# Patient Record
Sex: Female | Born: 1955 | Race: White | Hispanic: No | Smoking: Former smoker
Health system: Southern US, Community
[De-identification: ages and names within clinical notes are randomized; demographics above are authoritative.]

## PROBLEM LIST (undated history)

## (undated) DIAGNOSIS — I10 Essential (primary) hypertension: Secondary | ICD-10-CM

## (undated) DIAGNOSIS — F329 Major depressive disorder, single episode, unspecified: Secondary | ICD-10-CM

## (undated) DIAGNOSIS — F32A Depression, unspecified: Secondary | ICD-10-CM

## (undated) DIAGNOSIS — E785 Hyperlipidemia, unspecified: Secondary | ICD-10-CM

## (undated) HISTORY — DX: Depression, unspecified: F32.A

## (undated) HISTORY — DX: Essential (primary) hypertension: I10

## (undated) HISTORY — DX: Hyperlipidemia, unspecified: E78.5

---
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
## 2009-06-13 ENCOUNTER — Emergency Department (HOSPITAL_BASED_OUTPATIENT_CLINIC_OR_DEPARTMENT_OTHER): Admission: EM | Admit: 2009-06-13 | Discharge: 2009-06-13 | Payer: Self-pay | Admitting: Emergency Medicine

## 2012-04-19 ENCOUNTER — Emergency Department (HOSPITAL_BASED_OUTPATIENT_CLINIC_OR_DEPARTMENT_OTHER): Payer: BC Managed Care – PPO

## 2012-04-19 ENCOUNTER — Emergency Department (HOSPITAL_BASED_OUTPATIENT_CLINIC_OR_DEPARTMENT_OTHER)
Admission: EM | Admit: 2012-04-19 | Discharge: 2012-04-19 | Disposition: A | Discharge status: Home / Self Care | Payer: BC Managed Care – PPO | Attending: Emergency Medicine | Admitting: Emergency Medicine

## 2012-04-19 ENCOUNTER — Encounter (HOSPITAL_BASED_OUTPATIENT_CLINIC_OR_DEPARTMENT_OTHER): Payer: Self-pay | Admitting: Emergency Medicine

## 2012-04-19 DIAGNOSIS — Z79899 Other long term (current) drug therapy: Secondary | ICD-10-CM | POA: Insufficient documentation

## 2012-04-19 DIAGNOSIS — R05 Cough: Secondary | ICD-10-CM | POA: Insufficient documentation

## 2012-04-19 DIAGNOSIS — IMO0001 Reserved for inherently not codable concepts without codable children: Secondary | ICD-10-CM | POA: Insufficient documentation

## 2012-04-19 DIAGNOSIS — J029 Acute pharyngitis, unspecified: Secondary | ICD-10-CM | POA: Insufficient documentation

## 2012-04-19 DIAGNOSIS — R110 Nausea: Secondary | ICD-10-CM | POA: Insufficient documentation

## 2012-04-19 DIAGNOSIS — R0789 Other chest pain: Secondary | ICD-10-CM | POA: Insufficient documentation

## 2012-04-19 DIAGNOSIS — R509 Fever, unspecified: Secondary | ICD-10-CM | POA: Insufficient documentation

## 2012-04-19 DIAGNOSIS — R630 Anorexia: Secondary | ICD-10-CM | POA: Insufficient documentation

## 2012-04-19 DIAGNOSIS — B349 Viral infection, unspecified: Secondary | ICD-10-CM

## 2012-04-19 DIAGNOSIS — F17200 Nicotine dependence, unspecified, uncomplicated: Secondary | ICD-10-CM | POA: Insufficient documentation

## 2012-04-19 DIAGNOSIS — J3489 Other specified disorders of nose and nasal sinuses: Secondary | ICD-10-CM | POA: Insufficient documentation

## 2012-04-19 DIAGNOSIS — R51 Headache: Secondary | ICD-10-CM | POA: Insufficient documentation

## 2012-04-19 DIAGNOSIS — R059 Cough, unspecified: Secondary | ICD-10-CM | POA: Insufficient documentation

## 2012-04-19 DIAGNOSIS — B9789 Other viral agents as the cause of diseases classified elsewhere: Secondary | ICD-10-CM | POA: Insufficient documentation

## 2012-04-19 MED ORDER — KETOROLAC TROMETHAMINE 30 MG/ML IJ SOLN
30.0000 mg | Freq: Once | INTRAMUSCULAR | Status: AC
  Administered 2012-04-19: 30 mg via INTRAVENOUS
  Filled 2012-04-19: qty 1

## 2012-04-19 MED ORDER — SODIUM CHLORIDE 0.9 % IV BOLUS (SEPSIS)
1000.0000 mL | Freq: Once | INTRAVENOUS | Status: AC
  Administered 2012-04-19: 1000 mL via INTRAVENOUS

## 2012-04-19 MED ORDER — OSELTAMIVIR PHOSPHATE 75 MG PO CAPS: 75.0000 mg | ORAL_CAPSULE | Freq: Two times a day (BID) | ORAL | Status: DC

## 2012-04-19 NOTE — ED Provider Notes (Signed)
History     CSN: [id number]  Arrival date & time 04/19/12  0906   First MD Initiated Contact with Patient 04/19/12 1035      Chief Complaint  Patient presents with  . Cough  . Fever  . Headache  . soreness in ribs     (Consider location/radiation/quality/duration/timing/severity/associated sxs/prior treatment) HPI Comments: Patient presents with a two-day history of flulike symptoms. She states she's very fatigued and has no energy. She has myalgias. She has a bifrontal headache it radiates to the top of her head. She denies any neck pain other than her baseline chronic mild neck pain. She states that she's not having neck stiffness. She does have a cough which she says is dry and nonproductive. She says she sore across her chest from coughing. She denies any shortness of breath. She has a little bit of a sore throat but denies any trouble swallowing. She has little bit of nasal congestion. She has had some subjective fevers. She's had some nausea but no vomiting or diarrhea.  Patient is a 56 y.o. female presenting with cough, fever, and headaches.  Cough Associated symptoms include chills, headaches and myalgias. Pertinent negatives include no chest pain, no rhinorrhea and no shortness of breath.  Fever Primary symptoms of the febrile illness include fever, fatigue, headaches, cough, nausea and myalgias. Primary symptoms do not include shortness of breath, abdominal pain, vomiting, diarrhea, arthralgias or rash.  The headache is associated with weakness (Generalized). The headache is not associated with neck stiffness.  The myalgias are associated with weakness (Generalized).  Headache  Associated symptoms include a fever and nausea. Pertinent negatives include no shortness of breath and no vomiting.    History reviewed. No pertinent past medical history.  History reviewed. No pertinent past surgical history.  History reviewed. No pertinent family history.  History  Substance  Use Topics  . Smoking status: Current Some Day Smoker  . Smokeless tobacco: Not on file  . Alcohol Use: Yes     Comment: rare    OB History    Grav Para Term Preterm Abortions TAB SAB Ect Mult Living                  Review of Systems  Constitutional: Positive for fever, chills, appetite change and fatigue. Negative for diaphoresis.  HENT: Negative for congestion, rhinorrhea, sneezing and neck stiffness.   Eyes: Negative.   Respiratory: Positive for cough. Negative for chest tightness and shortness of breath.   Cardiovascular: Negative for chest pain and leg swelling.  Gastrointestinal: Positive for nausea. Negative for vomiting, abdominal pain, diarrhea and blood in stool.  Genitourinary: Negative for frequency, hematuria, flank pain and difficulty urinating.  Musculoskeletal: Positive for myalgias. Negative for back pain and arthralgias.  Skin: Negative for rash.  Neurological: Positive for weakness (Generalized) and headaches. Negative for dizziness, speech difficulty and numbness.    Allergies  Review of patient's allergies indicates no known allergies.  Home Medications   Current Outpatient Rx  Name  Route  Sig  Dispense  Refill  . OSELTAMIVIR PHOSPHATE 75 MG PO CAPS   Oral   Take 1 capsule (75 mg total) by mouth every 12 (twelve) hours.   10 capsule   0     BP 116/60  Pulse 92  Temp 99.9 F (37.7 C) (Oral)  Resp 20  Ht 5\' 1"  (1.549 m)  Wt 165 lb (74.844 kg)  BMI 31.18 kg/m2  SpO2 98%  Physical Exam  Constitutional: She is  oriented to person, place, and time. She appears well-developed and well-nourished.  HENT:  Head: Normocephalic and atraumatic.  Mouth/Throat: Oropharynx is clear and moist.  Eyes: Pupils are equal, round, and reactive to light.  Neck: Normal range of motion. Neck supple.       No neck stiffness  Cardiovascular: Normal rate, regular rhythm and normal heart sounds.   Pulmonary/Chest: Effort normal and breath sounds normal. No  respiratory distress. She has no wheezes. She has no rales. She exhibits no tenderness.  Abdominal: Soft. Bowel sounds are normal. There is no tenderness. There is no rebound and no guarding.  Musculoskeletal: Normal range of motion. She exhibits no edema.  Lymphadenopathy:    She has no cervical adenopathy.  Neurological: She is alert and oriented to person, place, and time.  Skin: Skin is warm and dry. No rash noted.  Psychiatric: She has a normal mood and affect.    ED Course  Procedures (including critical care time)  Labs Reviewed - No data to display No results found.   1. Viral syndrome       MDM  Patient presents with likely viral syndrome. She does have a headache but no neck stiffness and states that her neck pain is at baseline. I have a low suspicion of meningitis however I did discuss with the patient that the only way to rule out meningitis is to do a spinal tap. Patient is refusing this and wants to go home at this point. I did advise her to return if her symptoms are not improving. I feel that her symptoms are most consistent with influenza and I will give her prescription for Tamiflu.        Rolan Bucco, MD 04/19/12 [phone]

## 2012-04-19 NOTE — ED Notes (Signed)
Pt states she has non-productive cough, headache, fever since yesterday.  Pt states her ribs are sore from coughing.  Denies chest pain or SOB.  Pt relates she has been unable to sleep.

## 2015-10-13 DIAGNOSIS — F3342 Major depressive disorder, recurrent, in full remission: Secondary | ICD-10-CM | POA: Insufficient documentation

## 2015-10-13 DIAGNOSIS — Z860101 Personal history of adenomatous and serrated colon polyps: Secondary | ICD-10-CM

## 2015-10-13 DIAGNOSIS — Z0000 Encounter for general adult medical examination without abnormal findings: Secondary | ICD-10-CM | POA: Insufficient documentation

## 2015-10-13 DIAGNOSIS — K219 Gastro-esophageal reflux disease without esophagitis: Secondary | ICD-10-CM

## 2015-10-13 DIAGNOSIS — Z86010 Personal history of colonic polyps: Secondary | ICD-10-CM

## 2015-10-13 HISTORY — DX: Personal history of adenomatous and serrated colon polyps: Z86.0101

## 2015-10-13 HISTORY — DX: Gastro-esophageal reflux disease without esophagitis: K21.9

## 2015-10-13 HISTORY — DX: Encounter for general adult medical examination without abnormal findings: Z00.00

## 2015-10-13 HISTORY — DX: Major depressive disorder, recurrent, in full remission: F33.42

## 2019-05-13 ENCOUNTER — Telehealth: Payer: Self-pay | Admitting: General Practice

## 2019-05-13 NOTE — Telephone Encounter (Signed)
Please schedule patient for new patient appointment. Thank you!!! 

## 2019-05-13 NOTE — Telephone Encounter (Signed)
Pt would like to know if Dr Patsy Lager will accept her as a pt?  Her sister is Conservation officer, nature.  Is that OK?  He dr is retiring

## 2019-05-17 NOTE — Telephone Encounter (Signed)
LM for pt to call and schedule new pt appt with Dr. Patsy Lager.

## 2019-05-20 ENCOUNTER — Ambulatory Visit: Payer: BC Managed Care – PPO

## 2019-05-20 ENCOUNTER — Ambulatory Visit: Payer: Medicare PPO | Attending: Internal Medicine

## 2019-05-20 ENCOUNTER — Other Ambulatory Visit: Payer: BC Managed Care – PPO

## 2019-05-20 DIAGNOSIS — Z23 Encounter for immunization: Secondary | ICD-10-CM | POA: Insufficient documentation

## 2019-05-20 NOTE — Progress Notes (Signed)
   Covid-19 Vaccination Clinic  Name:  Beth Arroyo    MRN: [id number] DOB: 10/08/1955  05/20/2019  [name] was observed post Covid-19 immunization for 15 minutes without incidence. She was provided with Vaccine Information Sheet and instruction to access the V-Safe system.   [name] was instructed to call 911 with any severe reactions post vaccine: Marland Kitchen Difficulty breathing  . Swelling of your face and throat  . A fast heartbeat  . A bad rash all over your body  . Dizziness and weakness    Immunizations Administered    Name Date Dose VIS Date Route   Pfizer COVID-19 Vaccine 05/20/2019  9:22 AM 0.3 mL 04/09/2019 Intramuscular   Manufacturer: [employer], [employer]   Lot: [id number]   NDC: [address]-1

## 2019-06-09 ENCOUNTER — Ambulatory Visit: Payer: Medicare PPO | Attending: Internal Medicine

## 2019-06-09 DIAGNOSIS — Z23 Encounter for immunization: Secondary | ICD-10-CM | POA: Insufficient documentation

## 2019-06-09 NOTE — Progress Notes (Signed)
   Covid-19 Vaccination Clinic  Name:  Maebel Marasco    MRN: [id number] DOB: [birth year]  06/09/2019  Ms. Tung was observed post Covid-19 immunization for 15 minutes without incidence. She was provided with Vaccine Information Sheet and instruction to access the V-Safe system.   Ms. Spera was instructed to call 911 with any severe reactions post vaccine: Marland Kitchen Difficulty breathing  . Swelling of your face and throat  . A fast heartbeat  . A bad rash all over your body  . Dizziness and weakness    Immunizations Administered    Name Date Dose VIS Date Route   Pfizer COVID-19 Vaccine 06/09/2019  1:29 PM 0.3 mL 04/09/2019 Intramuscular   Manufacturer: [employer], [employer]   Lot: [id number]   NDC: [address]-3

## 2019-07-21 ENCOUNTER — Other Ambulatory Visit: Payer: Self-pay

## 2019-07-22 ENCOUNTER — Encounter: Payer: Self-pay | Admitting: Family Medicine

## 2019-07-22 ENCOUNTER — Other Ambulatory Visit: Payer: Self-pay

## 2019-07-22 ENCOUNTER — Ambulatory Visit: Payer: Medicare PPO | Admitting: Family Medicine

## 2019-07-22 VITALS — BP 126/88 | HR 65 | Resp 16 | Ht 62.0 in | Wt 203.0 lb

## 2019-07-22 DIAGNOSIS — Z1211 Encounter for screening for malignant neoplasm of colon: Secondary | ICD-10-CM

## 2019-07-22 DIAGNOSIS — Z634 Disappearance and death of family member: Secondary | ICD-10-CM

## 2019-07-22 DIAGNOSIS — F4321 Adjustment disorder with depressed mood: Secondary | ICD-10-CM

## 2019-07-22 DIAGNOSIS — I480 Paroxysmal atrial fibrillation: Secondary | ICD-10-CM | POA: Insufficient documentation

## 2019-07-22 DIAGNOSIS — I4891 Unspecified atrial fibrillation: Secondary | ICD-10-CM | POA: Diagnosis not present

## 2019-07-22 DIAGNOSIS — E785 Hyperlipidemia, unspecified: Secondary | ICD-10-CM | POA: Insufficient documentation

## 2019-07-22 DIAGNOSIS — E782 Mixed hyperlipidemia: Secondary | ICD-10-CM | POA: Diagnosis not present

## 2019-07-22 DIAGNOSIS — E669 Obesity, unspecified: Secondary | ICD-10-CM

## 2019-07-22 HISTORY — DX: Obesity, unspecified: E66.9

## 2019-07-22 HISTORY — DX: Disappearance and death of family member: Z63.4

## 2019-07-22 HISTORY — DX: Paroxysmal atrial fibrillation: I48.0

## 2019-07-22 HISTORY — DX: Disappearance and death of family member: F43.21

## 2019-07-22 NOTE — Patient Instructions (Addendum)
It was very nice to meet you today- I will look forward to working with you  We referred you to cardiology and also GI today Let me know when you need any medication refills  Please reach out if you are not doing ok- I am so very sorry for the loss of your son Beth Arroyo.

## 2019-07-22 NOTE — Progress Notes (Signed)
Beth Arroyo, Beth Arroyo, Beth Arroyo [id number] [phone]  Date:  07/22/2019   Name:  Beth Arroyo   DOB:  [birth year]   MRN:  [id number]  PCP:  Beth Mclean, MD    Chief Complaint: New Patient (Initial Visit)   History of Present Illness:  Beth Arroyo is a 64 y.o. very pleasant female patient who presents with the following:  Here today as a new patient to establish care Referred to see [place] by her sister Beth Arroyo who is also my patient Her PCP is retiring so she would like to establish with a new provider  She has history of paroxysmal atrial fibrillation, her cardiologist is Dr. Ramon Arroyo with [facility].  She was diagnosed with atrial fibrillation in 2020; incidental finding on a physical exam.  She did a heart monitor patch, had a few episodes of A. fib while on the monitor She is not able to feel when she is in atrial fibrillation Her cardiologist is actually also transferring out of town, so she needs referral to a local cardiologist  She is a retired Pharmacist, hospital, formally taught second grade at [employer] She retired in 2014- she keeps busy walking her dog, spending time with friends, gardening, reading  No living children; her son Beth Arroyo died in 54 at age 3.  She does not go into circumstances today This is still very hard for her - she is using wellbutin and zoloft.  Offered my condolences and support She is trying to watch her weight and lose the lbs she gained over the pandemic  Blood work done in November 2020 Pap 12/19/2017: Negative Mammogram 01/29/2018 Colonoscopy 09/21/2013-patient reports she is now due for follow-up Tdap 10/13/2015 There are no problems to display for this patient.   Past Medical History:  Diagnosis Date  . Depression   . Essential hypertension   . Hyperlipidemia     No past surgical history on file.  Social History   Tobacco Use  . Smoking status:  Current Some Day Smoker  . Smokeless tobacco: Never Used  Substance Use Topics  . Alcohol use: Yes    Comment: rare  . Drug use: Never    No family history on file.  No Known Allergies  Medication list has been reviewed and updated.  Current Outpatient Medications on File Prior to Visit  Medication Sig Dispense Refill  . apixaban (ELIQUIS) 5 MG TABS tablet Take by mouth.    Marland Kitchen buPROPion (WELLBUTRIN) 75 MG tablet TAKE 1 TABLET BY MOUTH TWICE A DAY    . metoprolol succinate (TOPROL-XL) 50 MG 24 hr tablet     . omeprazole (PRILOSEC) 20 MG capsule Take by mouth.    . sertraline (ZOLOFT) 50 MG tablet TAKE 1 TABLET BY MOUTH EVERY DAY    . simvastatin (ZOCOR) 10 MG tablet      No current facility-administered medications on file prior to visit.    Review of Systems:  As per HPI- otherwise negative.   Physical Examination: Vitals:   07/22/19 1405  BP: 126/88  Pulse: 65  Resp: 16  SpO2: 98%   Vitals:   07/22/19 1405  Weight: 203 lb (92.1 kg)  Height: 5\' 2"  (1.575 m)   Body mass index is 37.13 kg/m. Ideal Body Weight: Weight in (lb) to have BMI = 25: 136.4  GEN: no acute distress.  Obese, otherwise looks well HEENT: Atraumatic, Normocephalic.  Ears and Nose: No  external deformity. CV: RRR-in sinus rhythm currently, No M/G/R. No JVD. No thrill. No extra heart sounds. PULM: CTA B, no wheezes, crackles, rhonchi. No retractions. No resp. distress. No accessory muscle use. ABD: S, NT, ND, +BS. No rebound. No HSM. EXTR: No c/c/e PSYCH: Normally interactive. Conversant.  Assessment and Plan: Atrial fibrillation, unspecified type (HCC) - Plan: Ambulatory referral to Cardiology  Screening for colon cancer - Plan: Ambulatory referral to Gastroenterology  Paroxysmal atrial fibrillation (HCC)  Mixed hyperlipidemia  Obesity (BMI 35.0-39.9 without comorbidity)  Grief at loss of child  Here today to establish care.  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  She would like to change the rest of her care to low Beth Arroyo as well She does have a history of paroxysmal atrial fibrillation, on anticoagulation and rate control.  Referral to cardiology She states colonoscopy is not due, referral to GI Hyperlipidemia is treated She continues to work on her weight She is taking Wellbutrin and sertraline, has taken these medications since her son passed away 5 years ago.  She notes that she is much better than she was then, but of course this is still a struggle I asked her to follow-up with me in 6 to 9 months, sooner if any concerns This visit occurred during the SARS-CoV-2 public health emergency.  Safety protocols were in place, including screening questions prior to the visit, additional usage of staff PPE, and extensive cleaning of exam room while observing appropriate contact time as indicated for disinfecting solutions.    Signed Beth Amsterdam, MD

## 2019-07-29 ENCOUNTER — Encounter: Payer: Self-pay | Admitting: General Practice

## 2019-08-04 ENCOUNTER — Other Ambulatory Visit: Payer: Self-pay | Admitting: Family Medicine

## 2019-08-30 ENCOUNTER — Other Ambulatory Visit: Payer: Self-pay

## 2019-08-30 ENCOUNTER — Ambulatory Visit: Payer: Medicare PPO | Admitting: Cardiology

## 2019-08-30 ENCOUNTER — Encounter: Payer: Self-pay | Admitting: Cardiology

## 2019-08-30 VITALS — BP 124/80 | HR 49 | Ht 62.0 in | Wt 198.0 lb

## 2019-08-30 DIAGNOSIS — I10 Essential (primary) hypertension: Secondary | ICD-10-CM | POA: Diagnosis not present

## 2019-08-30 DIAGNOSIS — E663 Overweight: Secondary | ICD-10-CM | POA: Diagnosis not present

## 2019-08-30 DIAGNOSIS — I480 Paroxysmal atrial fibrillation: Secondary | ICD-10-CM

## 2019-08-30 DIAGNOSIS — E782 Mixed hyperlipidemia: Secondary | ICD-10-CM

## 2019-08-30 HISTORY — DX: Overweight: E66.3

## 2019-08-30 NOTE — Patient Instructions (Signed)
Medication Instructions:  No medication changes *If you need a refill on your cardiac medications before your next appointment, please call your pharmacy*   Lab Work: Your physician recommends that you have a BMET, TSH, LFT's and Lipids today in the office.  If you have labs (blood work) drawn today and your tests are completely normal, you will receive your results only by: Marland Kitchen MyChart Message (if you have MyChart) OR . A paper copy in the mail If you have any lab test that is abnormal or we need to change your treatment, we will call you to review the results.   Testing/Procedures: None ordered   Follow-Up: At [facility], you and your health needs are our priority.  As part of our continuing mission to provide you with exceptional heart care, we have created designated Provider Care Teams.  These Care Teams include your primary Cardiologist (physician) and Advanced Practice Providers (APPs -  Physician Assistants and Nurse Practitioners) who all work together to provide you with the care you need, when you need it.  We recommend signing up for the patient portal called "MyChart".  Sign up information is provided on this After Visit Summary.  MyChart is used to connect with patients for Virtual Visits (Telemedicine).  Patients are able to view lab/test results, encounter notes, upcoming appointments, etc.  Non-urgent messages can be sent to your provider as well.   To learn more about what you can do with MyChart, go to [email or web address].    Your next appointment:   6 month(s)  The format for your next appointment:   In Person  Provider:   Belva Crome, MD   Other Instructions NA

## 2019-08-30 NOTE — Progress Notes (Signed)
Cardiology Office Note:    Date:  08/30/2019   ID:  Beth Arroyo, DOB Jan 22, 1956, MRN [id number]  PCP:  Pearline Cables, MD  Cardiologist:  Garwin Brothers, MD   Referring MD: Pearline Cables, MD    ASSESSMENT:    1. Paroxysmal atrial fibrillation (HCC)   2. Mixed hyperlipidemia   3. Essential hypertension    PLAN:    In order of problems listed above:  1. Primary prevention stressed with the patient.  Importance of compliance with diet and medication stressed and she vocalized understanding.  2. Paroxysmal atrial fibrillation:I discussed with the patient atrial fibrillation, disease process. Management and therapy including rate and rhythm control, anticoagulation benefits and potential risks were discussed extensively with the patient. Patient had multiple questions which were answered to patient's satisfaction. 3. Essential hypertension: Blood pressure stable 4. Mixed dyslipidemia: Diet was emphasized we will do a Chem-7 TSH liver lipid check today. 5. Obesity: Diet was emphasized weight reduction was stressed and she promises to do better. 6. Patient will be seen in follow-up appointment in 6 months or earlier if the patient has any concerns    Medication Adjustments/Labs and Tests Ordered: Current medicines are reviewed at length with the patient today.  Concerns regarding medicines are outlined above.  Orders Placed This Encounter  Procedures   EKG 12-Lead   No orders of the defined types were placed in this encounter.    History of Present Illness:    Beth Arroyo is a 64 y.o. female who is being seen today for the evaluation of paroxysmal atrial fibrillation at the request of Copland, Gwenlyn Found, MD.  Patient is a pleasant 64 year old female.  She has past medical history approximately fibrillation, essential hypertension and dyslipidemia.  She is overweight.  She leads a sedentary lifestyle overall.  No chest pain orthopnea or PND.  At the time of my  evaluation, the patient is alert awake oriented and in no distress.  Past Medical History:  Diagnosis Date   Depression    Essential hypertension    Hyperlipidemia     Past Surgical History:  Procedure Laterality Date   CESAREAN SECTION  1988    Current Medications: Current Meds  Medication Sig   apixaban (ELIQUIS) 5 MG TABS tablet Take by mouth.   buPROPion (WELLBUTRIN) 75 MG tablet Take 75 mg by mouth daily.    metoprolol succinate (TOPROL-XL) 50 MG 24 hr tablet TAKE 1 TABLET BY MOUTH EVERY DAY   omeprazole (PRILOSEC) 20 MG capsule Take by mouth.   sertraline (ZOLOFT) 50 MG tablet TAKE 1 TABLET BY MOUTH EVERY DAY   simvastatin (ZOCOR) 10 MG tablet TAKE 1 TABLET BY MOUTH EVERY DAY AT NIGHT     Allergies:   Patient has no known allergies.   Social History   Socioeconomic History   Marital status: Divorced    Spouse name: Not on file   Number of children: Not on file   Years of education: Not on file   Highest education level: Not on file  Occupational History   Not on file  Tobacco Use   Smoking status: Former Smoker    Types: Cigarettes    Quit date: 08/29/2016    Years since quitting: 3.0   Smokeless tobacco: Never Used  Substance and Sexual Activity   Alcohol use: Yes    Comment: rare   Drug use: Never   Sexual activity: Not on file  Other Topics Concern   Not on file  Social  History Narrative   Not on file   Social Determinants of Health   Financial Resource Strain:    Difficulty of Paying Living Expenses:   Food Insecurity:    Worried About Charity fundraiser in the Last Year:    Arboriculturist in the Last Year:   Transportation Needs:    Film/video editor (Medical):    Lack of Transportation (Non-Medical):   Physical Activity:    Days of Exercise per Week:    Minutes of Exercise per Session:   Stress:    Feeling of Stress :   Social Connections:    Frequency of Communication with Friends and Family:     Frequency of Social Gatherings with Friends and Family:    Attends Religious Services:    Active Member of Clubs or Organizations:    Attends Music therapist:    Marital Status:      Family History: The patient's family history includes Diabetes in her mother; Heart disease in her father; Kidney cancer in her sister; Pulmonary disease in her mother; Thyroid cancer in her sister.  ROS:   Please see the history of present illness.    All other systems reviewed and are negative.  EKGs/Labs/Other Studies Reviewed:    The following studies were reviewed today: I reviewed my findings with the patient at length.  EKG reveals sinus rhythm and bradycardia and nonspecific ST-T changes.   Recent Labs: No results found for requested labs within last 8760 hours.  Recent Lipid Panel No results found for: CHOL, TRIG, HDL, CHOLHDL, VLDL, LDLCALC, LDLDIRECT  Physical Exam:    VS:  BP 124/80    Pulse (!) 49    Ht 5\' 2"  (1.575 m)    Wt 198 lb (89.8 kg)    SpO2 97%    BMI 36.21 kg/m     Wt Readings from Last 3 Encounters:  08/30/19 198 lb (89.8 kg)  07/22/19 203 lb (92.1 kg)  04/19/12 165 lb (74.8 kg)     GEN: Patient is in no acute distress HEENT: Normal NECK: No JVD; No carotid bruits LYMPHATICS: No lymphadenopathy CARDIAC: S1 S2 regular, 2/6 systolic murmur at the apex. RESPIRATORY:  Clear to auscultation without rales, wheezing or rhonchi  ABDOMEN: Soft, non-tender, non-distended MUSCULOSKELETAL:  No edema; No deformity  SKIN: Warm and dry NEUROLOGIC:  Alert and oriented x 3 PSYCHIATRIC:  Normal affect    Signed, Jenean Lindau, MD  08/30/2019 10:43 AM    [place]

## 2019-08-31 ENCOUNTER — Telehealth: Payer: Self-pay

## 2019-08-31 LAB — HEPATIC FUNCTION PANEL
ALT: 19 IU/L (ref 0–32)
AST: 17 IU/L (ref 0–40)
Albumin: 4.4 g/dL (ref 3.8–4.8)
Alkaline Phosphatase: 59 IU/L (ref 39–117)
Bilirubin Total: 0.4 mg/dL (ref 0.0–1.2)
Bilirubin, Direct: 0.14 mg/dL (ref 0.00–0.40)
Total Protein: 6.6 g/dL (ref 6.0–8.5)

## 2019-08-31 LAB — BASIC METABOLIC PANEL
BUN/Creatinine Ratio: 18 (ref 12–28)
BUN: 12 mg/dL (ref 8–27)
CO2: 20 mmol/L (ref 20–29)
Calcium: 9.4 mg/dL (ref 8.7–10.3)
Chloride: 107 mmol/L — ABNORMAL HIGH (ref 96–106)
Creatinine, Ser: 0.65 mg/dL (ref 0.57–1.00)
GFR calc Af Amer: 108 mL/min/{1.73_m2} (ref >59)
GFR calc non Af Amer: 93 mL/min/{1.73_m2} (ref >59)
Glucose: 94 mg/dL (ref 65–99)
Potassium: 4.8 mmol/L (ref 3.5–5.2)
Sodium: 142 mmol/L (ref 134–144)

## 2019-08-31 LAB — TSH: TSH: 1.68 u[IU]/mL (ref 0.450–4.500)

## 2019-08-31 LAB — LIPID PANEL
Chol/HDL Ratio: 3.4 ratio (ref 0.0–4.4)
Cholesterol, Total: 174 mg/dL (ref 100–199)
HDL: 51 mg/dL (ref >39)
LDL Chol Calc (NIH): 109 mg/dL — ABNORMAL HIGH (ref 0–99)
Triglycerides: 77 mg/dL (ref 0–149)
VLDL Cholesterol Cal: 14 mg/dL (ref 5–40)

## 2019-08-31 NOTE — Telephone Encounter (Signed)
Left message on patients voicemail to please return our call.   

## 2019-08-31 NOTE — Telephone Encounter (Signed)
-----   Message from Garwin Brothers, MD sent at 08/31/2019  8:16 AM EDT ----- The results of the study is unremarkable. Please inform patient. I will discuss in detail at next appointment. Cc  primary care/referring physician Garwin Brothers, MD 08/31/2019 8:16 AM

## 2019-09-01 ENCOUNTER — Telehealth: Payer: Self-pay

## 2019-09-01 NOTE — Telephone Encounter (Signed)
-----   Message from Rajan R Revankar, MD sent at 08/31/2019  8:16 AM EDT ----- The results of the study is unremarkable. Please inform patient. I will discuss in detail at next appointment. Cc  primary care/referring physician Rajan R Revankar, MD 08/31/2019 8:16 AM  

## 2019-09-01 NOTE — Telephone Encounter (Signed)
Spoke with patient regarding results and recommendation.  Patient verbalizes understanding and is agreeable to plan of care. Advised patient to call back with any issues or concerns.  

## 2019-09-01 NOTE — Telephone Encounter (Signed)
I spoke with this patient earlier today and gave her these results and recommendations.

## 2019-09-01 NOTE — Telephone Encounter (Signed)
Follow up   Pt is returning call    

## 2019-09-23 ENCOUNTER — Encounter: Payer: Self-pay | Admitting: Family Medicine

## 2019-12-20 ENCOUNTER — Encounter: Payer: Self-pay | Admitting: Family Medicine

## 2019-12-20 MED ORDER — ACETAMINOPHEN-CODEINE #3 300-30 MG PO TABS: 1.0000 | ORAL_TABLET | Freq: Three times a day (TID) | ORAL | 0 refills | Status: DC | PRN

## 2019-12-20 NOTE — Addendum Note (Signed)
Addended by: Pearline Cables on: 12/20/2019 08:45 PM   Modules accepted: Orders

## 2019-12-31 ENCOUNTER — Ambulatory Visit (INDEPENDENT_AMBULATORY_CARE_PROVIDER_SITE_OTHER): Payer: Medicare PPO | Admitting: Family

## 2019-12-31 ENCOUNTER — Encounter: Payer: Self-pay | Admitting: Family

## 2019-12-31 ENCOUNTER — Other Ambulatory Visit: Payer: Self-pay

## 2019-12-31 VITALS — BP 119/47 | HR 59 | Temp 98.5°F | Resp 16 | Ht 62.0 in | Wt 185.2 lb

## 2019-12-31 DIAGNOSIS — M5432 Sciatica, left side: Secondary | ICD-10-CM

## 2019-12-31 MED ORDER — METHYLPREDNISOLONE 4 MG PO TBPK: ORAL_TABLET | ORAL | 0 refills | Status: DC

## 2019-12-31 MED ORDER — CYCLOBENZAPRINE HCL 5 MG PO TABS: 5.0000 mg | ORAL_TABLET | Freq: Three times a day (TID) | ORAL | 0 refills | Status: DC | PRN

## 2019-12-31 NOTE — Progress Notes (Signed)
Subjective:    Patient ID: Beth Arroyo, female    DOB: 1955-11-19, 64 y.o.   MRN: [id number]  HPI  Patient is a 64 yr old female who presents today with chief complaint of back pain.  Reports long sending hx of back pain which flared on 12/19/19. Reports that she has had periodic sciatic flares since the age of 14.  Reports pain is in the lower back and radiates down the left buttock and left leg.   Reports that typically she takes NSAIDS but due to Eliquis she cannot take. Reports that she started the tylenol/codiene which "helped the pain but didn't resolve the problem."  Reports that laying down feels better than sitting/standing. No improvement with tylenol. Denies LE weakness or numbness. Denies loss of bowel or bladder.    Review of Systems See HPI  Past Medical History:  Diagnosis Date  . Depression   . Essential hypertension   . Hyperlipidemia      Social History   Socioeconomic History  . Marital status: Divorced    Spouse name: Not on file  . Number of children: Not on file  . Years of education: Not on file  . Highest education level: Not on file  Occupational History  . Not on file  Tobacco Use  . Smoking status: Former Smoker    Types: Cigarettes    Quit date: 08/29/2016    Years since quitting: 3.3  . Smokeless tobacco: Never Used  Substance and Sexual Activity  . Alcohol use: Yes    Comment: rare  . Drug use: Never  . Sexual activity: Not on file  Other Topics Concern  . Not on file  Social History Narrative  . Not on file   Social Determinants of Health   Financial Resource Strain:   . Difficulty of Paying Living Expenses: Not on file  Food Insecurity:   . Worried About Programme researcher, broadcasting/film/video in the Last Year: Not on file  . Ran Out of Food in the Last Year: Not on file  Transportation Needs:   . Lack of Transportation (Medical): Not on file  . Lack of Transportation (Non-Medical): Not on file  Physical Activity:   . Days of Exercise per  Week: Not on file  . Minutes of Exercise per Session: Not on file  Stress:   . Feeling of Stress : Not on file  Social Connections:   . Frequency of Communication with Friends and Family: Not on file  . Frequency of Social Gatherings with Friends and Family: Not on file  . Attends Religious Services: Not on file  . Active Member of Clubs or Organizations: Not on file  . Attends Banker Meetings: Not on file  . Marital Status: Not on file  Intimate Partner Violence:   . Fear of Current or Ex-Partner: Not on file  . Emotionally Abused: Not on file  . Physically Abused: Not on file  . Sexually Abused: Not on file    Past Surgical History:  Procedure Laterality Date  . CESAREAN SECTION  1988    Family History  Problem Relation Age of Onset  . Pulmonary disease Mother   . Diabetes Mother   . Heart disease Father   . Thyroid cancer Sister   . Kidney cancer Sister     No Known Allergies  Current Outpatient Medications on File Prior to Visit  Medication Sig Dispense Refill  . apixaban (ELIQUIS) 5 MG TABS tablet Take by mouth.    Marland Kitchen  buPROPion (WELLBUTRIN) 75 MG tablet Take 75 mg by mouth daily.     . metoprolol succinate (TOPROL-XL) 50 MG 24 hr tablet TAKE 1 TABLET BY MOUTH EVERY DAY 90 tablet 1  . omeprazole (PRILOSEC) 20 MG capsule Take by mouth.    . sertraline (ZOLOFT) 50 MG tablet TAKE 1 TABLET BY MOUTH EVERY DAY    . simvastatin (ZOCOR) 10 MG tablet TAKE 1 TABLET BY MOUTH EVERY DAY AT NIGHT 90 tablet 1   No current facility-administered medications on file prior to visit.    BP (!) 119/47 (BP Location: Right Arm, Patient Position: Sitting, Cuff Size: Large)   Pulse (!) 59   Temp 98.5 F (36.9 C) (Oral)   Resp 16   Ht 5\' 2"  (1.575 m)   Wt 185 lb 3.2 oz (84 kg)   SpO2 99%   BMI 33.87 kg/m       Objective:   Physical Exam Constitutional:      Appearance: She is well-developed.  Cardiovascular:     Rate and Rhythm: Normal rate and regular rhythm.      Heart sounds: Normal heart sounds. No murmur heard.   Pulmonary:     Effort: Pulmonary effort is normal. No respiratory distress.     Breath sounds: Normal breath sounds. No wheezing.  Neurological:     Deep Tendon Reflexes:     Reflex Scores:      Patellar reflexes are 2+ on the right side and 2+ on the left side.    Comments: Bilateral LE strength is 5/5   Psychiatric:        Behavior: Behavior normal.        Thought Content: Thought content normal.        Judgment: Judgment normal.           Assessment & Plan:  Sciatica (left)- Advised pt as follows:  Please begin medrol dose pak (steroid). You may use flexeril as needed (muscle relaxer). Do not drive after taking flexeril.  Please call if symptoms worsen or if symptoms are not improved in 1 week.   This visit occurred during the SARS-CoV-2 public health emergency.  Safety protocols were in place, including screening questions prior to the visit, additional usage of staff PPE, and extensive cleaning of exam room while observing appropriate contact time as indicated for disinfecting solutions.

## 2019-12-31 NOTE — Patient Instructions (Signed)
Please begin medrol dose pak (steroid). You may use flexeril as needed (muscle relaxer). Do not drive after taking flexeril.  Please call if symptoms worsen or if symptoms are not improved in 1 week.    Sciatica  Sciatica is pain, numbness, weakness, or tingling along the path of the sciatic nerve. The sciatic nerve starts in the lower back and runs down the back of each leg. The nerve controls the muscles in the lower leg and in the back of the knee. It also provides feeling (sensation) to the back of the thigh, the lower leg, and the sole of the foot. Sciatica is a symptom of another medical condition that pinches or puts pressure on the sciatic nerve. Sciatica most often only affects one side of the body. Sciatica usually goes away on its own or with treatment. In some cases, sciatica may come back (recur). What are the causes? This condition is caused by pressure on the sciatic nerve or pinching of the nerve. This may be the result of:  A disk in between the bones of the spine bulging out too far (herniated disk).  Age-related changes in the spinal disks.  A pain disorder that affects a muscle in the buttock.  Extra bone growth near the sciatic nerve.  A break (fracture) of the pelvis.  Pregnancy.  Tumor. This is rare. What increases the risk? The following factors may make you more likely to develop this condition:  Playing sports that place pressure or stress on the spine.  Having poor strength and flexibility.  A history of back injury or surgery.  Sitting for long periods of time.  Doing activities that involve repetitive bending or lifting.  Obesity. What are the signs or symptoms? Symptoms can vary from mild to very severe, and they may include:  Any of these problems in the lower back, leg, hip, or buttock: ? Mild tingling, numbness, or dull aches. ? Burning sensations. ? Sharp pains.  Numbness in the back of the calf or the sole of the foot.  Leg  weakness.  Severe back pain that makes movement difficult. Symptoms may get worse when you cough, sneeze, or laugh, or when you sit or stand for long periods of time. How is this diagnosed? This condition may be diagnosed based on:  Your symptoms and medical history.  A physical exam.  Blood tests.  Imaging tests, such as: ? X-rays. ? MRI. ? CT scan. How is this treated? In many cases, this condition improves on its own without treatment. However, treatment may include:  Reducing or modifying physical activity.  Exercising and stretching.  Icing and applying heat to the affected area.  Medicines that help to: ? Relieve pain and swelling. ? Relax your muscles.  Injections of medicines that help to relieve pain, irritation, and inflammation around the sciatic nerve (steroids).  Surgery. Follow these instructions at home: Medicines  Take over-the-counter and prescription medicines only as told by your health care provider.  Ask your health care provider if the medicine prescribed to you: ? Requires you to avoid driving or using heavy machinery. ? Can cause constipation. You may need to take these actions to prevent or treat constipation:  Drink enough fluid to keep your urine pale yellow.  Take over-the-counter or prescription medicines.  Eat foods that are high in fiber, such as beans, whole grains, and fresh fruits and vegetables.  Limit foods that are high in fat and processed sugars, such as fried or sweet foods. Managing pain  If directed, put ice on the affected area. ? Put ice in a plastic bag. ? Place a towel between your skin and the bag. ? Leave the ice on for 20 minutes, 2-3 times a day.  If directed, apply heat to the affected area. Use the heat source that your health care provider recommends, such as a moist heat pack or a heating pad. ? Place a towel between your skin and the heat source. ? Leave the heat on for 20-30 minutes. ? Remove the  heat if your skin turns bright red. This is especially important if you are unable to feel pain, heat, or cold. You may have a greater risk of getting burned. Activity   Return to your normal activities as told by your health care provider. Ask your health care provider what activities are safe for you.  Avoid activities that make your symptoms worse.  Take brief periods of rest throughout the day. ? When you rest for longer periods, mix in some mild activity or stretching between periods of rest. This will help to prevent stiffness and pain. ? Avoid sitting for long periods of time without moving. Get up and move around at least one time each hour.  Exercise and stretch regularly, as told by your health care provider.  Do not lift anything that is heavier than 10 lb (4.5 kg) while you have symptoms of sciatica. When you do not have symptoms, you should still avoid heavy lifting, especially repetitive heavy lifting.  When you lift objects, always use proper lifting technique, which includes: ? Bending your knees. ? Keeping the load close to your body. ? Avoiding twisting. General instructions  Maintain a healthy weight. Excess weight puts extra stress on your back.  Wear supportive, comfortable shoes. Avoid wearing high heels.  Avoid sleeping on a mattress that is too soft or too hard. A mattress that is firm enough to support your back when you sleep may help to reduce your pain.  Keep all follow-up visits as told by your health care provider. This is important. Contact a health care provider if:  You have pain that: ? Wakes you up when you are sleeping. ? Gets worse when you lie down. ? Is worse than you have experienced in the past. ? Lasts longer than 4 weeks.  You have an unexplained weight loss. Get help right away if:  You are not able to control when you urinate or have bowel movements (incontinence).  You have: ? Weakness in your lower back, pelvis, buttocks, or  legs that gets worse. ? Redness or swelling of your back. ? A burning sensation when you urinate. Summary  Sciatica is pain, numbness, weakness, or tingling along the path of the sciatic nerve.  This condition is caused by pressure on the sciatic nerve or pinching of the nerve.  Sciatica can cause pain, numbness, or tingling in the lower back, legs, hips, and buttocks.  Treatment often includes rest, exercise, medicines, and applying ice or heat. This information is not intended to replace advice given to you by your health care provider. Make sure you discuss any questions you have with your health care provider. Document Revised: 05/04/2018 Document Reviewed: 05/04/2018 Elsevier Patient Education  2020 [employer].

## 2020-02-05 ENCOUNTER — Encounter: Payer: Self-pay | Admitting: Family Medicine

## 2020-02-07 MED ORDER — BUPROPION HCL 75 MG PO TABS: 75.0000 mg | ORAL_TABLET | Freq: Every day | ORAL | 3 refills | Status: DC

## 2020-02-07 NOTE — Addendum Note (Signed)
Addended by: Abbe Amsterdam C on: 02/07/2020 01:14 PM   Modules accepted: Orders

## 2020-02-18 ENCOUNTER — Ambulatory Visit: Payer: Medicare PPO

## 2020-03-04 ENCOUNTER — Other Ambulatory Visit: Payer: Self-pay | Admitting: Family Medicine

## 2020-04-25 ENCOUNTER — Encounter: Payer: Self-pay | Admitting: Family Medicine

## 2020-04-26 ENCOUNTER — Other Ambulatory Visit: Payer: Self-pay | Admitting: Family Medicine

## 2020-04-26 MED ORDER — AMOXICILLIN-POT CLAVULANATE 875-125 MG PO TABS: 1.0000 | ORAL_TABLET | Freq: Two times a day (BID) | ORAL | 0 refills | Status: DC

## 2020-04-26 NOTE — Telephone Encounter (Signed)
Please see image patient attached.

## 2020-05-04 ENCOUNTER — Other Ambulatory Visit: Payer: Self-pay | Admitting: Family Medicine

## 2020-05-24 ENCOUNTER — Other Ambulatory Visit: Payer: Self-pay

## 2020-05-24 DIAGNOSIS — I499 Cardiac arrhythmia, unspecified: Secondary | ICD-10-CM | POA: Insufficient documentation

## 2020-05-24 DIAGNOSIS — F32A Depression, unspecified: Secondary | ICD-10-CM | POA: Insufficient documentation

## 2020-05-24 HISTORY — DX: Cardiac arrhythmia, unspecified: I49.9

## 2020-05-26 ENCOUNTER — Ambulatory Visit: Payer: Medicare PPO | Admitting: Cardiology

## 2020-05-26 ENCOUNTER — Encounter: Payer: Self-pay | Admitting: Cardiology

## 2020-05-26 ENCOUNTER — Other Ambulatory Visit: Payer: Self-pay

## 2020-05-26 VITALS — BP 118/70 | HR 56 | Ht 61.5 in | Wt 184.0 lb

## 2020-05-26 DIAGNOSIS — E669 Obesity, unspecified: Secondary | ICD-10-CM

## 2020-05-26 DIAGNOSIS — I10 Essential (primary) hypertension: Secondary | ICD-10-CM | POA: Diagnosis not present

## 2020-05-26 DIAGNOSIS — I480 Paroxysmal atrial fibrillation: Secondary | ICD-10-CM

## 2020-05-26 DIAGNOSIS — E782 Mixed hyperlipidemia: Secondary | ICD-10-CM

## 2020-05-26 MED ORDER — APIXABAN 5 MG PO TABS: 5.0000 mg | ORAL_TABLET | Freq: Two times a day (BID) | ORAL | 3 refills | Status: DC

## 2020-05-26 NOTE — Patient Instructions (Signed)

## 2020-05-26 NOTE — Progress Notes (Signed)
Cardiology Office Note:    Date:  05/26/2020   ID:  Beth Arroyo, DOB 07/12/1955, MRN [id number]  PCP:  Pearline Cables, MD  Cardiologist:  Garwin Brothers, MD   Referring MD: Pearline Cables, MD    ASSESSMENT:    No diagnosis found. PLAN:    In order of problems listed above:  1. Primary prevention stressed with the patient.  Importance of compliance with diet medication stressed and she vocalized understanding.  She was advised to half an hour a day on a daily basis 5 days a week and she promises to do so.  She is doing this regularly even at the present time. 2. Mixed dyslipidemia: Diet was emphasized.  Lipids were reviewed from last evaluation.  She is fasting today and we will repeat all blood work and send to primary care. 3. Paroxysmal atrial fibrillation:I discussed with the patient atrial fibrillation, disease process. Management and therapy including rate and rhythm control, anticoagulation benefits and potential risks were discussed extensively with the patient. Patient had multiple questions which were answered to patient's satisfaction. 4. Essential hypertension: Diet was emphasized.  Importance of regular walking stressed lifestyle modification and salt intake issues were discussed. 5. Obesity: Weight reduction stressed.  Risks of obesity explained and we had a long chat about this.  She is agreeable and plans to be focused and come back with better results in follow-up appointment. 6. Patient will be seen in follow-up appointment in 6 months or earlier if the patient has any concerns    Medication Adjustments/Labs and Tests Ordered: Current medicines are reviewed at length with the patient today.  Concerns regarding medicines are outlined above.  No orders of the defined types were placed in this encounter.  No orders of the defined types were placed in this encounter.    No chief complaint on file.    History of Present Illness:    Beth Arroyo  is a 65 y.o. female.  Patient has past medical history of essential hypertension, dyslipidemia, obesity and paroxysmal atrial fibrillation.  She denies any problems at this time and takes care of activities of daily living.  No chest pain orthopnea or PND.  At the time of my evaluation, the patient is alert awake oriented and in no distress.  Past Medical History:  Diagnosis Date  . Arrhythmia 05/24/2020  . Depression   . Essential hypertension   . GERD (gastroesophageal reflux disease) 10/13/2015  . Grief at loss of child 07/22/2019  . Hyperlipidemia   . Obesity (BMI 35.0-39.9 without comorbidity) 07/22/2019  . Overweight 08/30/2019  . Paroxysmal atrial fibrillation (HCC) 07/22/2019  . Recurrent major depressive disorder, in full remission (HCC) 10/13/2015    Past Surgical History:  Procedure Laterality Date  . CESAREAN SECTION  1988    Current Medications: Current Meds  Medication Sig  . apixaban (ELIQUIS) 5 MG TABS tablet Take 5 mg by mouth 2 (two) times daily.  Marland Kitchen buPROPion (WELLBUTRIN) 75 MG tablet Take 1 tablet (75 mg total) by mouth daily.  . metoprolol succinate (TOPROL-XL) 50 MG 24 hr tablet Take 1 tablet (50 mg total) by mouth daily.  Marland Kitchen omeprazole (PRILOSEC) 20 MG capsule Take 20 mg by mouth daily.  . sertraline (ZOLOFT) 50 MG tablet Take 50 mg by mouth daily.  . simvastatin (ZOCOR) 10 MG tablet Take 1 tablet (10 mg total) by mouth daily at 6 PM.     Allergies:   Patient has no known allergies.   Social History  Socioeconomic History  . Marital status: Divorced    Spouse name: Not on file  . Number of children: Not on file  . Years of education: Not on file  . Highest education level: Not on file  Occupational History  . Not on file  Tobacco Use  . Smoking status: Former Smoker    Types: Cigarettes    Quit date: 08/29/2016    Years since quitting: 3.7  . Smokeless tobacco: Never Used  Substance and Sexual Activity  . Alcohol use: Yes    Comment: rare  . Drug use:  Never  . Sexual activity: Not on file  Other Topics Concern  . Not on file  Social History Narrative  . Not on file   Social Determinants of Health   Financial Resource Strain: Not on file  Food Insecurity: Not on file  Transportation Needs: Not on file  Physical Activity: Not on file  Stress: Not on file  Social Connections: Not on file     Family History: The patient's family history includes Diabetes in her mother; Heart disease in her father; Kidney cancer in her sister; Pulmonary disease in her mother; Thyroid cancer in her sister.  ROS:   Please see the history of present illness.    All other systems reviewed and are negative.  EKGs/Labs/Other Studies Reviewed:    The following studies were reviewed today: I discussed my findings with the patient at length.   Recent Labs: 08/30/2019: ALT 19; BUN 12; Creatinine, Ser 0.65; Potassium 4.8; Sodium 142; TSH 1.680  Recent Lipid Panel    Component Value Date/Time   CHOL 174 08/30/2019 1053   TRIG 77 08/30/2019 1053   HDL 51 08/30/2019 1053   CHOLHDL 3.4 08/30/2019 1053   LDLCALC 109 (H) 08/30/2019 1053    Physical Exam:    VS:  BP 118/70   Pulse (!) 56   Ht 5' 1.5" (1.562 m)   Wt 184 lb 0.6 oz (83.5 kg)   SpO2 98%   BMI 34.21 kg/m     Wt Readings from Last 3 Encounters:  05/26/20 184 lb 0.6 oz (83.5 kg)  12/31/19 185 lb 3.2 oz (84 kg)  08/30/19 198 lb (89.8 kg)     GEN: Patient is in no acute distress HEENT: Normal NECK: No JVD; No carotid bruits LYMPHATICS: No lymphadenopathy CARDIAC: Hear sounds regular, 2/6 systolic murmur at the apex. RESPIRATORY:  Clear to auscultation without rales, wheezing or rhonchi  ABDOMEN: Soft, non-tender, non-distended MUSCULOSKELETAL:  No edema; No deformity  SKIN: Warm and dry NEUROLOGIC:  Alert and oriented x 3 PSYCHIATRIC:  Normal affect   Signed, Garwin Brothers, MD  05/26/2020 11:11 AM    [facility]

## 2020-05-27 LAB — LIPID PANEL
Chol/HDL Ratio: 3.4 ratio (ref 0.0–4.4)
Cholesterol, Total: 179 mg/dL (ref 100–199)
HDL: 52 mg/dL (ref >39)
LDL Chol Calc (NIH): 112 mg/dL — ABNORMAL HIGH (ref 0–99)
Triglycerides: 79 mg/dL (ref 0–149)
VLDL Cholesterol Cal: 15 mg/dL (ref 5–40)

## 2020-05-27 LAB — CBC WITH DIFFERENTIAL/PLATELET
Basophils Absolute: 0.1 10*3/uL (ref 0.0–0.2)
Basos: 1 %
EOS (ABSOLUTE): 0.4 10*3/uL (ref 0.0–0.4)
Eos: 4 %
Hematocrit: 46.2 % (ref 34.0–46.6)
Hemoglobin: 15.4 g/dL (ref 11.1–15.9)
Immature Grans (Abs): 0 10*3/uL (ref 0.0–0.1)
Immature Granulocytes: 0 %
Lymphocytes Absolute: 2.6 10*3/uL (ref 0.7–3.1)
Lymphs: 31 %
MCH: 30.4 pg (ref 26.6–33.0)
MCHC: 33.3 g/dL (ref 31.5–35.7)
MCV: 91 fL (ref 79–97)
Monocytes Absolute: 0.6 10*3/uL (ref 0.1–0.9)
Monocytes: 8 %
Neutrophils Absolute: 4.7 10*3/uL (ref 1.4–7.0)
Neutrophils: 56 %
Platelets: 341 10*3/uL (ref 150–450)
RBC: 5.06 x10E6/uL (ref 3.77–5.28)
RDW: 12 % (ref 11.7–15.4)
WBC: 8.4 10*3/uL (ref 3.4–10.8)

## 2020-05-27 LAB — BASIC METABOLIC PANEL
BUN/Creatinine Ratio: 20 (ref 12–28)
BUN: 13 mg/dL (ref 8–27)
CO2: 24 mmol/L (ref 20–29)
Calcium: 9.6 mg/dL (ref 8.7–10.3)
Chloride: 102 mmol/L (ref 96–106)
Creatinine, Ser: 0.66 mg/dL (ref 0.57–1.00)
GFR calc Af Amer: 106 mL/min/{1.73_m2} (ref >59)
GFR calc non Af Amer: 92 mL/min/{1.73_m2} (ref >59)
Glucose: 89 mg/dL (ref 65–99)
Potassium: 4.6 mmol/L (ref 3.5–5.2)
Sodium: 139 mmol/L (ref 134–144)

## 2020-05-27 LAB — TSH: TSH: 1.68 u[IU]/mL (ref 0.450–4.500)

## 2020-05-27 LAB — HEPATIC FUNCTION PANEL
ALT: 20 IU/L (ref 0–32)
AST: 20 IU/L (ref 0–40)
Albumin: 4.5 g/dL (ref 3.8–4.8)
Alkaline Phosphatase: 58 IU/L (ref 44–121)
Bilirubin Total: 0.4 mg/dL (ref 0.0–1.2)
Bilirubin, Direct: 0.15 mg/dL (ref 0.00–0.40)
Total Protein: 6.5 g/dL (ref 6.0–8.5)

## 2020-05-30 ENCOUNTER — Encounter: Payer: Self-pay | Admitting: Family Medicine

## 2020-05-30 MED ORDER — SERTRALINE HCL 50 MG PO TABS: 50.0000 mg | ORAL_TABLET | Freq: Every day | ORAL | 1 refills | Status: DC

## 2020-06-12 NOTE — Progress Notes (Signed)
[facility] [address], [address] [place], [place] [address] [phone]  Date:  06/14/2020   Name:  Beth Arroyo   DOB:  07-05-1955   MRN:  [id number]  PCP:  Pearline Cables, MD    Chief Complaint: Hand Swelling (Cat scratch months ago, took 2 antibiotics, trouble bending, soreness, knot on finger unsure if related)   History of Present Illness:  Beth Arroyo is a 65 y.o. very pleasant female patient who presents with the following:  Pt here today with concern of right hand pain  Last seen by myself about one year ago to establish care, see below:  She has history of paroxysmal atrial fibrillation, her cardiologist is Dr. Caro Hight with [facility].  She was diagnosed with atrial fibrillation in 2020; incidental finding on a physical exam.  She did a heart monitor patch, had a few episodes of A. fib while on the monitor She is not able to feel when she is in atrial fibrillation Her cardiologist is actually also transferring out of town, so she needs referral to a local cardiologist  She is a retired Runner, broadcasting/film/video, formally taught second grade at [employer] She retired in 2014- she keeps busy walking her dog, spending time with friends, gardening, reading  No living children; her son Beth Arroyo died in 67 at age 7.  She does not go into circumstances today This is still very hard for her - she is using wellbutin and zoloft.    Hep C screening Bone density scan- 2018, can update today  Flu vaccine- done 01/29/20 covid vaccine- booster done as well  Pneumonia series - done per pt she does not have dates on her but recalls done by her prior PCP   Most recent labs January of this year  She had a cat scratch to her right index finger- this occurred at the end of December She was treated with augmentin and it got much better  It seemed to be doing well, but over this past weekend it swelled up again.  She is not sure if  related to the scratch or not.  She notes a nodule in the palmar aspect of the finger and swelling of the entire finger,  It is harder to bend due to swelling No trigger finger It is not especially tender No heat or redness No fever   Patient Active Problem List   Diagnosis Date Noted   Arrhythmia 05/24/2020   Depression    Essential hypertension 08/30/2019   Overweight 08/30/2019   Paroxysmal atrial fibrillation (HCC) 07/22/2019   Hyperlipidemia 07/22/2019   Obesity (BMI 35.0-39.9 without comorbidity) 07/22/2019   Grief at loss of child 07/22/2019   GERD (gastroesophageal reflux disease) 10/13/2015   Recurrent major depressive disorder, in full remission (HCC) 10/13/2015    Past Medical History:  Diagnosis Date   Arrhythmia 05/24/2020   Depression    Essential hypertension    GERD (gastroesophageal reflux disease) 10/13/2015   Grief at loss of child 07/22/2019   Hyperlipidemia    Obesity (BMI 35.0-39.9 without comorbidity) 07/22/2019   Overweight 08/30/2019   Paroxysmal atrial fibrillation (HCC) 07/22/2019   Recurrent major depressive disorder, in full remission (HCC) 10/13/2015    Past Surgical History:  Procedure Laterality Date   CESAREAN SECTION  1988    Social History   Tobacco Use   Smoking status: Former Smoker    Types: Cigarettes    Quit date: 08/29/2016  Years since quitting: 3.7   Smokeless tobacco: Never Used  Substance Use Topics   Alcohol use: Yes    Comment: rare   Drug use: Never    Family History  Problem Relation Age of Onset   Pulmonary disease Mother    Diabetes Mother    Heart disease Father    Thyroid cancer Sister    Kidney cancer Sister     No Known Allergies  Medication list has been reviewed and updated.  Current Outpatient Medications on File Prior to Visit  Medication Sig Dispense Refill   apixaban (ELIQUIS) 5 MG TABS tablet Take 1 tablet (5 mg total) by mouth 2 (two) times daily. 180 tablet 3    buPROPion (WELLBUTRIN) 75 MG tablet Take 1 tablet (75 mg total) by mouth daily. 90 tablet 3   metoprolol succinate (TOPROL-XL) 50 MG 24 hr tablet Take 1 tablet (50 mg total) by mouth daily. 90 tablet 0   omeprazole (PRILOSEC) 20 MG capsule Take 20 mg by mouth daily.     sertraline (ZOLOFT) 50 MG tablet Take 1 tablet (50 mg total) by mouth daily. 90 tablet 1   simvastatin (ZOCOR) 10 MG tablet Take 1 tablet (10 mg total) by mouth daily at 6 PM. 90 tablet 0   No current facility-administered medications on file prior to visit.    Review of Systems:  As per HPI- otherwise negative.   Physical Examination: Vitals:   06/14/20 0959  BP: 126/80  Pulse: (!) 51  Resp: 17  SpO2: 99%   Vitals:   06/14/20 0959  Weight: 188 lb (85.3 kg)  Height: 5' 1.5" (1.562 m)   Body mass index is 34.95 kg/m. Ideal Body Weight: Weight in (lb) to have BMI = 25: 134.2  GEN: no acute distress.  Obese, looks well  HEENT: Atraumatic, Normocephalic.  Ears and Nose: No external deformity. CV: RRR, No M/G/R. No JVD. No thrill. No extra heart sounds. PULM: CTA B, no wheezes, crackles, rhonchi. No retractions. No resp. distress. No accessory muscle use. ABD: S, NT, ND, +BS. No rebound. No HSM. EXTR: No c/c/e PSYCH: Normally interactive. Conversant.  Right index finger is diffusely swollen, moderate Normal ROM No heat or redness Not especially tender There is a mobile nodule on the palmar aspect over the middle phalanx   Pulse Readings from Last 3 Encounters:  06/14/20 (!) 51  05/26/20 (!) 56  12/31/19 (!) 59    Assessment and Plan: Swelling of right index finger - Plan: DG Hand Complete Right, amoxicillin-clavulanate (AUGMENTIN) 875-125 MG tablet, Ambulatory referral to Hand Surgery  Estrogen deficiency - Plan: DG Bone Density  Screening for osteoporosis - Plan: DG Bone Density  Pt here today with concern of right index finger swelling about 2 months after a cat scratch occurred to the same  finger.  She was treated with augmentin at that time and seemed to get better Current sx may be related or perhaps not Obtain films today Treat with augmentin for 10 days in case element of infection from cat scratch injury Referral to hand surgery   This visit occurred during the SARS-CoV-2 public health emergency.  Safety protocols were in place, including screening questions prior to the visit, additional usage of staff PPE, and extensive cleaning of exam room while observing appropriate contact time as indicated for disinfecting solutions.    Signed Abbe Amsterdam, MD

## 2020-06-12 NOTE — Patient Instructions (Incomplete)
Good to see you again today!   Please go to the ground floor to have an x-ray of your hand.  They may also be able to do your bone density scan for you now Let's treat you with augmentin again in case there is any element of infection I will plan to have you see orthopedics as well for you hand  Please consider getting Shingrix (shingles vaccine) at your pharmacy at your convenience

## 2020-06-14 ENCOUNTER — Telehealth: Payer: Self-pay | Admitting: Family Medicine

## 2020-06-14 ENCOUNTER — Other Ambulatory Visit: Payer: Self-pay

## 2020-06-14 ENCOUNTER — Ambulatory Visit (INDEPENDENT_AMBULATORY_CARE_PROVIDER_SITE_OTHER): Payer: Medicare PPO | Admitting: Family Medicine

## 2020-06-14 ENCOUNTER — Encounter: Payer: Self-pay | Admitting: Family Medicine

## 2020-06-14 ENCOUNTER — Ambulatory Visit (HOSPITAL_BASED_OUTPATIENT_CLINIC_OR_DEPARTMENT_OTHER)
Admission: RE | Admit: 2020-06-14 | Discharge: 2020-06-14 | Disposition: A | Discharge status: Home / Self Care | Payer: Medicare PPO | Source: Ambulatory Visit | Attending: Family Medicine | Admitting: Family Medicine

## 2020-06-14 VITALS — BP 126/80 | HR 51 | Resp 17 | Ht 61.5 in | Wt 188.0 lb

## 2020-06-14 DIAGNOSIS — E2839 Other primary ovarian failure: Secondary | ICD-10-CM

## 2020-06-14 DIAGNOSIS — M79641 Pain in right hand: Secondary | ICD-10-CM | POA: Diagnosis not present

## 2020-06-14 DIAGNOSIS — M7989 Other specified soft tissue disorders: Secondary | ICD-10-CM | POA: Insufficient documentation

## 2020-06-14 DIAGNOSIS — Z13820 Encounter for screening for osteoporosis: Secondary | ICD-10-CM

## 2020-06-14 DIAGNOSIS — R600 Localized edema: Secondary | ICD-10-CM | POA: Diagnosis not present

## 2020-06-14 MED ORDER — AMOXICILLIN-POT CLAVULANATE 875-125 MG PO TABS: 1.0000 | ORAL_TABLET | Freq: Two times a day (BID) | ORAL | 0 refills | Status: DC

## 2020-06-14 NOTE — Telephone Encounter (Signed)
Pt had a visit today with provider and stated if provider is going to send her to Orthopedic to please refer her to one in [facility]. Pt wanted to make sure we are informed on the location.

## 2020-06-16 ENCOUNTER — Encounter: Payer: Self-pay | Admitting: Family Medicine

## 2020-06-16 NOTE — Telephone Encounter (Signed)
Patient states she would now like to be referral to [facility]. Everlene Other, MD Hand & Wrist, Elbow & Arm,Hand Surgeon

## 2020-06-20 ENCOUNTER — Encounter: Payer: Self-pay | Admitting: Family Medicine

## 2020-06-20 DIAGNOSIS — M7989 Other specified soft tissue disorders: Secondary | ICD-10-CM

## 2020-06-21 NOTE — Telephone Encounter (Signed)
Done

## 2020-06-29 ENCOUNTER — Ambulatory Visit (HOSPITAL_BASED_OUTPATIENT_CLINIC_OR_DEPARTMENT_OTHER)
Admission: RE | Admit: 2020-06-29 | Discharge: 2020-06-29 | Disposition: A | Discharge status: Home / Self Care | Payer: Medicare PPO | Source: Ambulatory Visit | Attending: Family Medicine | Admitting: Family Medicine

## 2020-06-29 ENCOUNTER — Other Ambulatory Visit: Payer: Self-pay

## 2020-06-29 DIAGNOSIS — M85851 Other specified disorders of bone density and structure, right thigh: Secondary | ICD-10-CM | POA: Diagnosis not present

## 2020-06-29 DIAGNOSIS — Z13820 Encounter for screening for osteoporosis: Secondary | ICD-10-CM | POA: Insufficient documentation

## 2020-06-29 DIAGNOSIS — Z780 Asymptomatic menopausal state: Secondary | ICD-10-CM | POA: Diagnosis not present

## 2020-06-29 DIAGNOSIS — E2839 Other primary ovarian failure: Secondary | ICD-10-CM | POA: Diagnosis not present

## 2020-06-30 ENCOUNTER — Encounter: Payer: Self-pay | Admitting: Family Medicine

## 2020-06-30 DIAGNOSIS — M8580 Other specified disorders of bone density and structure, unspecified site: Secondary | ICD-10-CM | POA: Insufficient documentation

## 2020-06-30 HISTORY — DX: Other specified disorders of bone density and structure, unspecified site: M85.80

## 2020-07-06 DIAGNOSIS — R200 Anesthesia of skin: Secondary | ICD-10-CM | POA: Diagnosis not present

## 2020-07-06 DIAGNOSIS — M79641 Pain in right hand: Secondary | ICD-10-CM | POA: Diagnosis not present

## 2020-07-06 DIAGNOSIS — M65849 Other synovitis and tenosynovitis, unspecified hand: Secondary | ICD-10-CM | POA: Diagnosis not present

## 2020-07-06 DIAGNOSIS — G7249 Other inflammatory and immune myopathies, not elsewhere classified: Secondary | ICD-10-CM | POA: Diagnosis not present

## 2020-07-06 DIAGNOSIS — M65841 Other synovitis and tenosynovitis, right hand: Secondary | ICD-10-CM | POA: Diagnosis not present

## 2020-07-07 DIAGNOSIS — M79641 Pain in right hand: Secondary | ICD-10-CM | POA: Diagnosis not present

## 2020-07-07 DIAGNOSIS — G7249 Other inflammatory and immune myopathies, not elsewhere classified: Secondary | ICD-10-CM | POA: Diagnosis not present

## 2020-07-12 DIAGNOSIS — M65841 Other synovitis and tenosynovitis, right hand: Secondary | ICD-10-CM | POA: Diagnosis not present

## 2020-07-12 DIAGNOSIS — R200 Anesthesia of skin: Secondary | ICD-10-CM | POA: Diagnosis not present

## 2020-07-20 ENCOUNTER — Other Ambulatory Visit: Payer: Self-pay | Admitting: Family Medicine

## 2020-07-31 DIAGNOSIS — G5601 Carpal tunnel syndrome, right upper limb: Secondary | ICD-10-CM | POA: Diagnosis not present

## 2020-07-31 DIAGNOSIS — M65841 Other synovitis and tenosynovitis, right hand: Secondary | ICD-10-CM | POA: Diagnosis not present

## 2020-07-31 DIAGNOSIS — M65321 Trigger finger, right index finger: Secondary | ICD-10-CM | POA: Diagnosis not present

## 2020-08-30 DIAGNOSIS — M65841 Other synovitis and tenosynovitis, right hand: Secondary | ICD-10-CM | POA: Diagnosis not present

## 2020-08-30 DIAGNOSIS — M79641 Pain in right hand: Secondary | ICD-10-CM | POA: Diagnosis not present

## 2020-08-30 DIAGNOSIS — G5601 Carpal tunnel syndrome, right upper limb: Secondary | ICD-10-CM | POA: Diagnosis not present

## 2020-08-31 ENCOUNTER — Telehealth: Payer: Self-pay | Admitting: Cardiology

## 2020-08-31 NOTE — Telephone Encounter (Signed)
   [facility] Pre-operative Risk Assessment    Patient Name: Beth Arroyo  DOB: 24-Aug-1955  MRN: [id number]   HEARTCARE STAFF: - Please ensure there is not already an duplicate clearance open for this procedure. - Under Visit Info/Reason for Call, type in Other and utilize the format Clearance MM/DD/YY or Clearance TBD. Do not use dashes or single digits. - If request is for dental extraction, please clarify the # of teeth to be extracted.  Request for surgical clearance:  1. What type of surgery is being performed? Carpal Tunnel ; A1 Pulley Release  2. When is this surgery scheduled? 09/05/20  3. What type of clearance is required (medical clearance vs. Pharmacy clearance to hold med vs. Both)? Both   4. Are there any medications that need to be held prior to surgery and how long?  Eliquis 3 days prior  5. Practice name and name of physician performing surgery? [facility]; Dr.Gramig   6. What is the office phone number? [phone]   7.   What is the office fax number? [phone]  8.   Anesthesia type (None, local, MAC, general) ? Block with IV sedation   Beth Arroyo 08/31/2020, 11:14 AM  _________________________________________________________________   (provider comments below)

## 2020-08-31 NOTE — Telephone Encounter (Signed)
   Name: Beth Arroyo  DOB: 1955/05/25  MRN: [id number]   Primary Cardiologist: Garwin Brothers, MD  Chart reviewed as part of pre-operative protocol coverage. Patient was contacted 08/31/2020 in reference to pre-operative risk assessment for pending surgery as outlined below.  Beth Arroyo was last seen on 05/26/20 by Dr. Tomie China.  Since that day, Beth Arroyo has done fine from a cardiac standpoint. She can complete 4 METs without anginal complaints.  Therefore, based on ACC/AHA guidelines, the patient would be at acceptable risk for the planned procedure without further cardiovascular testing.   The patient was advised that if she develops new symptoms prior to surgery to contact our office to arrange for a follow-up visit, and she verbalized understanding.  Per pharmacy recommendations, patient can hold eliquis 2-3 days prior to her upcoming surgery with plans to restart as soon as he is cleared to do so by her surgery.  I will route this recommendation to the requesting party via Epic fax function and remove from pre-op pool. Please call with questions.  [name], PA-C 08/31/2020, 3:41 PM

## 2020-08-31 NOTE — Telephone Encounter (Signed)
Patient with diagnosis of afib on Eliquis for anticoagulation.    Procedure: Carpal Tunnel ; A1 Pulley Release Date of procedure: 09/05/20  CHA2DS2-VASc Score = 3  This indicates a 3.2% annual risk of stroke. The patient's score is based upon: CHF History: No HTN History: Yes Diabetes History: No Stroke History: No Vascular Disease History: No Age Score: 1 Gender Score: 1     CrCl 84 ml/min Platelet count 341  Per office protocol, patient can hold Eliquis for 2-3 days prior to procedure.

## 2020-08-31 NOTE — Telephone Encounter (Signed)
Pharmacy, can you please comment on how long Eliquis can be held for upcoming procedure?  Thank you! 

## 2020-09-06 ENCOUNTER — Encounter: Payer: Self-pay | Admitting: Family Medicine

## 2020-09-06 ENCOUNTER — Other Ambulatory Visit: Payer: Self-pay | Admitting: Family Medicine

## 2020-09-06 MED ORDER — METOPROLOL SUCCINATE ER 50 MG PO TB24: 50.0000 mg | ORAL_TABLET | Freq: Every day | ORAL | 1 refills | Status: DC

## 2020-09-19 DIAGNOSIS — M65141 Other infective (teno)synovitis, right hand: Secondary | ICD-10-CM | POA: Diagnosis not present

## 2020-09-19 DIAGNOSIS — M65841 Other synovitis and tenosynovitis, right hand: Secondary | ICD-10-CM | POA: Diagnosis not present

## 2020-09-19 DIAGNOSIS — G8918 Other acute postprocedural pain: Secondary | ICD-10-CM | POA: Diagnosis not present

## 2020-09-19 DIAGNOSIS — Z0189 Encounter for other specified special examinations: Secondary | ICD-10-CM | POA: Diagnosis not present

## 2020-09-19 DIAGNOSIS — G5601 Carpal tunnel syndrome, right upper limb: Secondary | ICD-10-CM | POA: Diagnosis not present

## 2020-09-21 ENCOUNTER — Ambulatory Visit (INDEPENDENT_AMBULATORY_CARE_PROVIDER_SITE_OTHER): Payer: Medicare PPO | Admitting: Internal Medicine

## 2020-09-21 ENCOUNTER — Other Ambulatory Visit: Payer: Self-pay

## 2020-09-21 ENCOUNTER — Telehealth: Payer: Self-pay

## 2020-09-21 DIAGNOSIS — M65941 Unspecified synovitis and tenosynovitis, right hand: Secondary | ICD-10-CM | POA: Insufficient documentation

## 2020-09-21 DIAGNOSIS — M7989 Other specified soft tissue disorders: Secondary | ICD-10-CM | POA: Diagnosis not present

## 2020-09-21 DIAGNOSIS — M659 Synovitis and tenosynovitis, unspecified: Secondary | ICD-10-CM

## 2020-09-21 HISTORY — DX: Unspecified synovitis and tenosynovitis, right hand: M65.941

## 2020-09-21 HISTORY — DX: Synovitis and tenosynovitis, unspecified: M65.9

## 2020-09-21 MED ORDER — AMOXICILLIN-POT CLAVULANATE 875-125 MG PO TABS: 1.0000 | ORAL_TABLET | Freq: Two times a day (BID) | ORAL | 0 refills | Status: DC

## 2020-09-21 NOTE — Telephone Encounter (Signed)
Called and spoke w/ Dr. Amanda Pea office and requested office notice and op notes. Pt had surgery on 09/20/2020, rt hand. Was told by [employer] that notes will be faxed.

## 2020-09-21 NOTE — Progress Notes (Signed)
[facility]  Reason for Consult: Right index finger tenosynovitis Referring Provider: Dr. Onalee Hua  Assessment: I discussed the operative findings with Dr. Amanda Pea who is very concerned about smoldering infectious tenosynovitis.  She and her sister both feel like she did improve while on short courses of amoxicillin clavulanate suggesting that she is got a relatively common bacterial infection, possibly Pasteurella.  Nonetheless, given the operative findings and the smoldering nature of her process I agree with a wide diagnostic approach.  I reviewed treatment options with Andrey Campanile and her sister.  We are in agreement with restarting oral amoxicillin clavulanate pending further review of stains, cultures and pathology.  She will follow-up with me next week.  Plan: 1. Restart oral amoxicillin clavulanate pending stains, cultures and pathology review.  I have asked our laboratory staff to contact [facility] and asked them to fax all results to our clinic. 2. Follow-up in 1 week  Patient Active Problem List   Diagnosis Date Noted  . Tenosynovitis of right hand 09/21/2020    Priority: High  . Osteopenia 06/30/2020  . Arrhythmia 05/24/2020  . Depression   . Essential hypertension 08/30/2019  . Overweight 08/30/2019  . Paroxysmal atrial fibrillation (HCC) 07/22/2019  . Hyperlipidemia 07/22/2019  . Obesity (BMI 35.0-39.9 without comorbidity) 07/22/2019  . Grief at loss of child 07/22/2019  . GERD (gastroesophageal reflux disease) 10/13/2015  . Recurrent major depressive disorder, in full remission (HCC) 10/13/2015    Patient's Medications  New Prescriptions   No medications on file  Previous Medications   APIXABAN (ELIQUIS) 5 MG TABS TABLET    Take 1 tablet (5 mg total) by mouth 2 (two) times daily.   BUPROPION (WELLBUTRIN) 75 MG TABLET    Take 1 tablet (75 mg total) by mouth daily.   METOPROLOL SUCCINATE (TOPROL-XL) 50 MG 24 HR TABLET     Take 1 tablet (50 mg total) by mouth daily.   OMEPRAZOLE (PRILOSEC) 20 MG CAPSULE    Take 20 mg by mouth daily.   SERTRALINE (ZOLOFT) 50 MG TABLET    TAKE 1 TABLET BY MOUTH EVERY DAY   SIMVASTATIN (ZOCOR) 10 MG TABLET    Take 1 tablet (10 mg total) by mouth daily at 6 PM.  Modified Medications   Modified Medication Previous Medication   AMOXICILLIN-CLAVULANATE (AUGMENTIN) 875-125 MG TABLET amoxicillin-clavulanate (AUGMENTIN) 875-125 MG tablet      Take 1 tablet by mouth 2 (two) times daily.    Take 1 tablet by mouth 2 (two) times daily.  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  She suffered a puncture wound from the cat's toenail on the dorsum of her right index finger.  There was no bleeding.  She washed the area well but began to note pain, swelling and stiffness over the next week.  She received 2 short courses of amoxicillin clavulanate.  She had some modest but incomplete improvement.  Because of persistent swelling she was referred to Dr. Amanda Pea and saw him for the first time on 07/07/2020.  She was diagnosed with inflammatory flexor tenosynovitis of uncertain cause.  No clear evidence of infection or other abnormalities were noted on MRI.  She eventually underwent several steroid injections with some, transient decrease in swelling.  She was finally taken to the OR yesterday where she underwent extensive debridement of  her right index finger.  Dr. Carlos Levering operative note said "it was very apparent she had a vast amount of tenosynovitis in the finger".  He submitted specimens for Gram stain, aerobic, anaerobic cultures, AFB stain and culture and fungal stain and culture as well as for pathology review.  All of those results are pending at this time.  She is left-handed.  Review of Systems: Review of Systems  Constitutional: Negative for diaphoresis, fever and malaise/fatigue.   Gastrointestinal: Negative for abdominal pain, diarrhea, nausea and vomiting.  Musculoskeletal: Positive for joint pain.      Past Medical History:  Diagnosis Date  . Arrhythmia 05/24/2020  . Depression   . Essential hypertension   . GERD (gastroesophageal reflux disease) 10/13/2015  . Grief at loss of child 07/22/2019  . Hyperlipidemia   . Obesity (BMI 35.0-39.9 without comorbidity) 07/22/2019  . Overweight 08/30/2019  . Paroxysmal atrial fibrillation (HCC) 07/22/2019  . Recurrent major depressive disorder, in full remission (HCC) 10/13/2015    Social History   Tobacco Use  . Smoking status: Former Smoker    Types: Cigarettes    Quit date: 08/29/2016    Years since quitting: 4.0  . Smokeless tobacco: Never Used  Substance Use Topics  . Alcohol use: Yes    Comment: rare  . Drug use: Never    Family History  Problem Relation Age of Onset  . Pulmonary disease Mother   . Diabetes Mother   . Heart disease Father   . Thyroid cancer Sister   . Kidney cancer Sister    No Known Allergies  OBJECTIVE: Vitals:   09/21/20 0912  BP: 129/80  Pulse: (!) 55  Temp: 98.5 F (36.9 C)  SpO2: 99%  Weight: 195 lb (88.5 kg)   Body mass index is 36.25 kg/m.   Physical Exam Constitutional:      Comments: She is very pleasant and in no obvious distress.  She is accompanied by her sister.  Chest:  Breasts:     Right: No axillary adenopathy or supraclavicular adenopathy.    Musculoskeletal:     Comments: She has a bulky dressing on her right hand.  She has no erythema or swelling extending onto her forearm.  Her sister shows me pictures taken after surgery yesterday showing an incision from the tip of her index finger extending down onto her palm.  Lymphadenopathy:     Upper Body:     Right upper body: No supraclavicular, axillary or epitrochlear adenopathy.     Microbiology: No results found for this or any previous visit (from the past 240 hour(s)).  Cliffton Asters,  MD [facility] [phone] pager   [phone] cell 09/21/2020, 12:41 PM

## 2020-09-28 ENCOUNTER — Telehealth (INDEPENDENT_AMBULATORY_CARE_PROVIDER_SITE_OTHER): Payer: Medicare PPO | Admitting: Internal Medicine

## 2020-09-28 ENCOUNTER — Other Ambulatory Visit: Payer: Self-pay

## 2020-09-28 ENCOUNTER — Encounter: Payer: Self-pay | Admitting: Internal Medicine

## 2020-09-28 DIAGNOSIS — M659 Synovitis and tenosynovitis, unspecified: Secondary | ICD-10-CM

## 2020-09-28 NOTE — Progress Notes (Signed)
Virtual Visit via Telephone Note  I connected with Beth Arroyo on 09/28/20 at 10:00 AM EDT by telephone and verified that I am speaking with the correct person using two identifiers.  Location: Patient: Home Provider: [facility]   I discussed the limitations, risks, security and privacy concerns of performing an evaluation and management service by telephone and the availability of in person appointments. I also discussed with the patient that there may be a patient responsible charge related to this service. The patient expressed understanding and agreed to proceed.   History of Present Illness: I called and spoke with Beth Arroyo today.  She feels like she is doing better.  Her surgical bandage was removed by Dr. Amanda Pea yesterday.  Her sutures remain in.  She feels like the swelling, especially in her palm has improved significantly.  She has no unusual redness and no drainage from her incision.  She will be starting physical therapy.  She has not had any fever.  She is tolerating her amoxicillin clavulanate.  She has not had any nausea, vomiting or diarrhea.   Observations/Objective: All operative stains for bacteria, fungi and AFB are negative Routine aerobic and anaerobic cultures are negative AFB and fungal cultures are negative so far  Assessment and Plan: She seems to be improving on empiric amoxicillin clavulanate.  I will continue it for now and have her follow-up in 1 week.  Follow Up Instructions: Continue amoxicillin clavulanate Await results of final cultures Follow-up in 1 week   I discussed the assessment and treatment plan with the patient. The patient was provided an opportunity to ask questions and all were answered. The patient agreed with the plan and demonstrated an understanding of the instructions.   The patient was advised to call back or seek an in-person evaluation if the symptoms worsen or if the condition fails to improve as anticipated.  I provided 15 minutes of  non-face-to-face time during this encounter.   Cliffton Asters, MD

## 2020-10-02 ENCOUNTER — Encounter: Payer: Self-pay | Admitting: Occupational Therapy

## 2020-10-02 ENCOUNTER — Ambulatory Visit: Payer: Medicare PPO | Admitting: Physical Therapy

## 2020-10-02 ENCOUNTER — Other Ambulatory Visit: Payer: Self-pay

## 2020-10-02 ENCOUNTER — Ambulatory Visit: Payer: Medicare PPO | Attending: Orthopedic Surgery | Admitting: Occupational Therapy

## 2020-10-02 DIAGNOSIS — R600 Localized edema: Secondary | ICD-10-CM | POA: Insufficient documentation

## 2020-10-02 DIAGNOSIS — M6281 Muscle weakness (generalized): Secondary | ICD-10-CM | POA: Diagnosis not present

## 2020-10-02 DIAGNOSIS — M25641 Stiffness of right hand, not elsewhere classified: Secondary | ICD-10-CM | POA: Diagnosis not present

## 2020-10-02 DIAGNOSIS — R29898 Other symptoms and signs involving the musculoskeletal system: Secondary | ICD-10-CM | POA: Insufficient documentation

## 2020-10-03 NOTE — Therapy (Signed)
[facility] [address] [place], [place], [address] Phone: [phone]   Fax:  [phone]  Occupational Therapy Evaluation  Patient Details  Name: Beth Arroyo MRN: [id number] Date of Birth: 1955/09/20 Referring Provider (OT): Dominica Severin, MD Raechel Chute)   Encounter Date: 10/02/2020   OT End of Session - 10/03/20 1035    Visit Number 1    Number of Visits 7    Date for OT Re-Evaluation 12/31/20    Authorization Type [employer]    Authorization Time Period VL: ? ($20 copay)    Progress Note Due on Visit 10    OT Start Time 1530    OT Stop Time 1620    OT Time Calculation (min) 50 min    Activity Tolerance Patient tolerated treatment well    Behavior During Therapy [facility] for tasks assessed/performed           Past Medical History:  Diagnosis Date  . Arrhythmia 05/24/2020  . Depression   . Essential hypertension   . GERD (gastroesophageal reflux disease) 10/13/2015  . Grief at loss of child 07/22/2019  . Hyperlipidemia   . Obesity (BMI 35.0-39.9 without comorbidity) 07/22/2019  . Overweight 08/30/2019  . Paroxysmal atrial fibrillation (HCC) 07/22/2019  . Recurrent major depressive disorder, in full remission (HCC) 10/13/2015    Past Surgical History:  Procedure Laterality Date  . CESAREAN SECTION  1988    There were no vitals filed for this visit.   Subjective Assessment - 10/02/20 1534    Subjective  Pt arrives to session today with primary complaint of stiffness in her R index finger s/p carpal tunnel release and flexor tendon (FDP and FDS) synovectomy. Pt reports she was playing with her cat in December of last year (2021) when she received a claw puncture; no inital complications, but subsequently experienced persistent swelling and stiffness in her R index finger and radial palmar region. Pt also states she developed numbess and tingling following median n. pattern, which has largely resolved since  surgery; "it still feels weird."    Pertinent History R carpal tunnel release and flexor tendon (FDP and FDS) synovectomy for "extensive tenosynovitis" of R index finger; depression; arrhythmia    Patient Stated Goals Functional use of R hand    Currently in Pain? No/denies             [facility] OT Assessment - 10/02/20 1536      Assessment   Medical Diagnosis R carpal tunnel release and flexor tendon (FDP/FDS) synovectomy aftercare    Referring Provider (OT) Dominica Severin, MD   [facility]   Onset Date/Surgical Date 09/19/20   Initial symptoms noted 04/25/20   Hand Dominance Left    Prior Therapy No      Precautions   Precautions None    Required Braces or Orthoses Other Brace/Splint    Other Brace/Splint Immobilization orthosis   wearing prior to sx on 09/19/20; has not worn since     Restrictions   Weight Bearing Restrictions No   per pt report     Balance Screen   Has the patient fallen in the past 6 months No      Home  Environment   Type of Home House    Home Layout Two level    Lives With Alone      Prior Function   Level of Independence Independent    Vocation Retired    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  Leisure walk, game night w/ friends, go to dinner/movies      ADL   Eating/Feeding Needs assist with cutting food    Grooming Modified independent    Upper Body Bathing Modified independent    Lower Body Bathing Modified independent    Upper Body Dressing Independent    Lower Body Dressing Independent    ADL comments Reports difficulty opening containers and w/ bilateral coordination tasks      Written Expression   Dominant Hand Left    Written Experience Within Functional Limits      Cognition   Overall Cognitive Status Within Functional Limits for tasks assessed      Observation/Other Assessments   Observations Pt reports sutures were removed this morning      Sensation   Light Touch Appears Intact   Pt does report altered  sensation, but does not demo lack of sensation   Additional Comments To be further tested in functional context      Coordination   Coordination FMC to be further tested in functional context; unable to functionally oppose R thumb to index finger at this time      Edema   Edema Unable to assess at this time due to dressing      ROM / Strength   AROM / PROM / Strength AROM      Palpation   Palpation comment Mild tenderness to palpation, particularly in palm and DIP      AROM   AROM Assessment Site Wrist    Right Wrist Extension 50 Degrees    Right Wrist Flexion 59 Degrees    Right Wrist Radial Deviation 23 Degrees    Right Wrist Ulnar Deviation 30 Degrees    Left Wrist Extension 78 Degrees    Left Wrist Flexion 74 Degrees    Left Wrist Radial Deviation 26 Degrees    Left Wrist Ulnar Deviation 32 Degrees      Right Hand AROM   R Thumb Opposition to Index 2.5 cm    R Index  MCP 0-90 47 Degrees    R Index PIP 0-100 30 Degrees    R Index DIP 0-70 19 Degrees      Left Hand AROM   L Thumb Opposition to Index --    L Index  MCP 0-90 74 Degrees    L Index PIP 0-100 95 Degrees    L Index DIP 0-70 67 Degrees      Hand Function   Right Hand Gross Grasp Impaired    Left Hand Gross Grasp Functional             OT Education - 10/02/20 1600    Education Details Education provided on role and purpose of OT, as well as potential interventions and goals for therapy. OT also initiated AROM exercises for R index and adjacent fingers, per physician referral order.    Person(s) Educated Patient    Methods Explanation;Demonstration;Handout    Comprehension Verbalized understanding;Returned demonstration             OT Short Term Goals - 10/02/20 1630      OT SHORT TERM GOAL #1   Title Pt will increase AROM of R index MCP, PIP, and DIP flexion by at least 9* to facilitate functional grasp w/ R hand    Baseline R index MCP 47*, PIP 30*, DIP 19*    Time 3    Period Weeks     Status New    Target Date 10/27/20  OT SHORT TERM GOAL #2   Title Pt will be able to open medium-large containers using both hands (and AE prn) 100% of the time w/out pain    Baseline Reports difficulty opening containers due to decreased functional use of R hand    Time 3    Period Weeks    Status New    Target Date 10/27/20      OT SHORT TERM GOAL #3   Title Pt will decrease R thumb to index finger opposition to at least 1.5 cm to improve functional pinch prehension when manipulating clothing fasteners    Baseline R thumb to index finger opposition at 2.5 cm    Time 3    Period Weeks    Status New      OT SHORT TERM GOAL #4   Title Pt will improve wrist flexion/extension by at least 10* to improve participation w/ IADLs    Baseline R wrist flexion (59*) and extension (50*); L wrist 74* and 78*    Time 3    Period Weeks    Status New             OT Long Term Goals - 10/02/20 1630      OT LONG TERM GOAL #1   Title Pt will be independent w/ full HEP designed for ROM and strength of R hand/digits    Baseline No formal HEP at this time    Time 6    Period Weeks    Status New    Target Date 11/20/20      OT LONG TERM GOAL #2   Title Pt will increase AROM of R index finger MCP flexion by at least 14* to improve ability to open medication bottles    Baseline R index MCP 47*    Time 6    Period Weeks    Status New      OT LONG TERM GOAL #3   Title Pt will increase AROM of R index finger PIP flexion by greater than 20* to facilitate functional grasp/hold w/ R hand    Baseline R index PIP 30*    Time 6    Period Weeks    Status New      OT LONG TERM GOAL #4   Title Pt will demonstrate loose gross grasp w/ R hand to improve participation in functional bilateral tasks    Baseline Unable to achieve full grasp w/ R hand at this time    Time 6    Period Weeks    Status New      OT LONG TERM GOAL #5   Title Pt will be able to oppose R thumb to index finger within  acceptable level of discomfort to improve pinch prehension during functional FM tasks    Baseline R thumb to index finger opposition at 2.5 cm    Time 6    Period Weeks    Status New             Plan - 10/02/20 1615    Clinical Impression Statement Pt is a 65 y.o. female who presents to OP OT s/p R carpal tunnel release and flexor tendon (FDP and FDS) synovectomy on 09/19/20. Pt is also currently being followed by infectious disease due to injury sustained while playing w/ her cat and resultant infection potentially causing/contributing to experienced symptoms. PMHx includes depression, arrhythmia, HTN, and HLD. Pt will benefit from skilled occupational therapy services to address strength, ROM, pain management, altered sensation,  FMC, compensatory strategies (including AE prn), and implementation of an HEP to improve participation and safety during ADLs and IADLs.    OT Occupational Profile and History Problem Focused Assessment - Including review of records relating to presenting problem    Occupational performance deficits (Please refer to evaluation for details): ADL's;IADL's;Leisure;Social Participation    Body Structure / Function / Physical Skills ADL;Decreased knowledge of use of DME;Strength;Pain;Dexterity;Body mechanics;Edema;UE functional use;ROM;IADL;Scar mobility;Sensation;Coordination;FMC    Rehab Potential Good    Clinical Decision Making Several treatment options, min-mod task modification necessary    Comorbidities Affecting Occupational Performance: May have comorbidities impacting occupational performance    Modification or Assistance to Complete Evaluation  Min-Moderate modification of tasks or assist with assess necessary to complete eval    OT Frequency 1x / week    OT Duration 6 weeks    OT Treatment/Interventions Self-care/ADL training;Moist Heat;Fluidtherapy;DME and/or AE instruction;Splinting;Therapeutic activities;Ultrasound;Therapeutic exercise;Scar  mobilization;Passive range of motion;Cryotherapy;Electrical Stimulation;Paraffin;Manual Therapy;Patient/family education    Plan Review goals w/ pt; assess progress w/ HEP, include AAROM/PROM to digits; initiate AROM/AAROM of wrist?    Consulted and Agree with Plan of Care Patient           Patient will benefit from skilled therapeutic intervention in order to improve the following deficits and impairments:   Body Structure / Function / Physical Skills: ADL,Decreased knowledge of use of DME,Strength,Pain,Dexterity,Body mechanics,Edema,UE functional use,ROM,IADL,Scar mobility,Sensation,Coordination,FMC       Visit Diagnosis: Stiffness of right hand, not elsewhere classified  Localized edema  Muscle weakness (generalized)  Other symptoms and signs involving the musculoskeletal system    Problem List Patient Active Problem List   Diagnosis Date Noted  . Tenosynovitis of right hand 09/21/2020  . Osteopenia 06/30/2020  . Arrhythmia 05/24/2020  . Depression   . Essential hypertension 08/30/2019  . Overweight 08/30/2019  . Paroxysmal atrial fibrillation (HCC) 07/22/2019  . Hyperlipidemia 07/22/2019  . Obesity (BMI 35.0-39.9 without comorbidity) 07/22/2019  . Grief at loss of child 07/22/2019  . GERD (gastroesophageal reflux disease) 10/13/2015  . Recurrent major depressive disorder, in full remission (HCC) 10/13/2015     Rosie Fate, OTR/L, MSOT 10/02/2020, 4:30 PM  [facility] [address] [place], [place], [address] Phone: [phone]   Fax:  [phone]  Name: Almedia Cordell MRN: [id number] Date of Birth: 11-07-1955

## 2020-10-04 ENCOUNTER — Telehealth (INDEPENDENT_AMBULATORY_CARE_PROVIDER_SITE_OTHER): Payer: Medicare PPO | Admitting: Internal Medicine

## 2020-10-04 ENCOUNTER — Other Ambulatory Visit: Payer: Self-pay

## 2020-10-04 DIAGNOSIS — M7989 Other specified soft tissue disorders: Secondary | ICD-10-CM

## 2020-10-04 MED ORDER — AMOXICILLIN-POT CLAVULANATE 875-125 MG PO TABS: 1.0000 | ORAL_TABLET | Freq: Two times a day (BID) | ORAL | 0 refills | Status: DC

## 2020-10-04 NOTE — Progress Notes (Signed)
Virtual Visit via Telephone Note  I connected with Beth Arroyo on 10/04/20 at  9:45 AM EDT by telephone and verified that I am speaking with the correct person using two identifiers.  Location: Patient: Home Provider: [facility]   I discussed the limitations, risks, security and privacy concerns of performing an evaluation and management service by telephone and the availability of in person appointments. I also discussed with the patient that there may be a patient responsible charge related to this service. The patient expressed understanding and agreed to proceed.   History of Present Illness: I called and spoke with Beth Arroyo today.  She is about to complete 2 weeks of amoxicillin clavulanate therapy following surgery on 09/19/2020 for presumed infectious tenosynovitis of her right hand.  She is not having any problems tolerating her antibiotics.  She still has a little bit of swelling in her hand but overall feels like she is doing much better.  She recently started occupational therapy.   Observations/Objective: Surgical pathology report: Noncaseating granulomatous inflammation Gram, AFB and fungal smears are negative All cultures remain negative to date  Assessment and Plan: Her pathology report certainly suggest a strong possibility of atypical infection with fungi or AFB.  Fortunately she is improving on empiric amoxicillin clavulanate.  I told her that selecting empiric therapy for fungal or AFB skin infection is incredibly difficult because there is no standard therapy for either and that depends on specific species that are identified.  I will have her continue amoxicillin clavulanate for now and follow-up in 1 week.  Follow Up Instructions: Continue amoxicillin clavulanate pending final cultures Follow-up on 10/12/2020   I discussed the assessment and treatment plan with the patient. The patient was provided an opportunity to ask questions and all were answered. The patient agreed  with the plan and demonstrated an understanding of the instructions.   The patient was advised to call back or seek an in-person evaluation if the symptoms worsen or if the condition fails to improve as anticipated.  I provided 16 minutes of non-face-to-face time during this encounter.   Cliffton Asters, MD

## 2020-10-09 ENCOUNTER — Other Ambulatory Visit: Payer: Self-pay

## 2020-10-09 ENCOUNTER — Encounter: Payer: Self-pay | Admitting: Occupational Therapy

## 2020-10-09 ENCOUNTER — Ambulatory Visit: Payer: Medicare PPO | Admitting: Occupational Therapy

## 2020-10-09 DIAGNOSIS — R29898 Other symptoms and signs involving the musculoskeletal system: Secondary | ICD-10-CM

## 2020-10-09 DIAGNOSIS — M25641 Stiffness of right hand, not elsewhere classified: Secondary | ICD-10-CM | POA: Diagnosis not present

## 2020-10-09 DIAGNOSIS — R600 Localized edema: Secondary | ICD-10-CM

## 2020-10-09 DIAGNOSIS — M6281 Muscle weakness (generalized): Secondary | ICD-10-CM

## 2020-10-10 NOTE — Therapy (Signed)
[facility] [address] [place], [place], [address] Phone: [phone]   Fax:  [phone]  Occupational Therapy Treatment  Patient Details  Name: Beth Arroyo MRN: [id number] Date of Birth: [birth year] Referring Provider (OT): Dominica Severin, MD Raechel Chute)   Encounter Date: 10/09/2020   OT End of Session - 10/09/20 1619     Visit Number 2    Number of Visits 7    Date for OT Re-Evaluation 12/31/20    Authorization Type [employer]    Authorization Time Period VL: 1/7 ($20 copay)    Progress Note Due on Visit 10    OT Start Time 1016    OT Stop Time 1108    OT Time Calculation (min) 52 min    Activity Tolerance Patient tolerated treatment well    Behavior During Therapy [facility] for tasks assessed/performed             Past Medical History:  Diagnosis Date   Arrhythmia 05/24/2020   Depression    Essential hypertension    GERD (gastroesophageal reflux disease) 10/13/2015   Grief at loss of child 07/22/2019   Hyperlipidemia    Obesity (BMI 35.0-39.9 without comorbidity) 07/22/2019   Overweight 08/30/2019   Paroxysmal atrial fibrillation (HCC) 07/22/2019   Recurrent major depressive disorder, in full remission (HCC) 10/13/2015    Past Surgical History:  Procedure Laterality Date   CESAREAN SECTION  1988    There were no vitals filed for this visit.   Subjective Assessment - 10/09/20 1017     Subjective  "Working it makes it feel sore" (pt pointed to her R wrist); "    Pertinent History R carpal tunnel release and flexor tendon (FDP and FDS) synovectomy for "extensive tenosynovitis" of R index finger; depression; arrhythmia    Patient Stated Goals Functional use of R hand    Currently in Pain? No/denies    Pain Score 2     Pain Location Wrist    Pain Orientation Right    Pain Descriptors / Indicators Sore              OT Treatments/Exercises - 10/09/20 1145 AROM of R wrist flexion/extension x15; pt  instructed to complete repetitions slowly and hold end-range stretch for approx. 3 sec.  Isolated AROM of R index finger PIP flexion w/ joint blocking. Exercise was included in HEP administered in evaluation session, but pt required clarification on positioning for blocking; OT reviewed exercise w/ pt and had pt complete x10 slowly  Isolated AROM of R index finger DIP flexion w/ joint blocking; completed x10 slowly w/ OT reviewing positioning for blocking  OT-facilitated PROM of index finger MCP, PIP, and DIP flexion/extension; pt able to tolerate w/out difficulty or pain and able to achieve good ROM of each joint     OT Education - 10/09/20 1618     Education Details Condition-specific education continued; HEP reviewed and updated    Person(s) Educated Patient    Methods Explanation;Handout;Demonstration    Comprehension Verbalized understanding;Returned demonstration              OT Short Term Goals - 10/09/20 1143       OT SHORT TERM GOAL #1   Title Pt will increase AROM of R index MCP, PIP, and DIP flexion by at least 9* to facilitate functional grasp w/ R hand    Baseline R index MCP 47*, PIP 30*, DIP 19*    Time 3    Period Weeks  Status On-going    Target Date 10/27/20      OT SHORT TERM GOAL #2   Title Pt will be able to open medium-large containers using both hands (and AE prn) 100% of the time w/out pain    Baseline Reports difficulty opening containers due to decreased functional use of R hand    Time 3    Period Weeks    Status On-going    Target Date 10/27/20      OT SHORT TERM GOAL #3   Title Pt will decrease R thumb to index finger opposition to at least 1.5 cm to improve functional pinch prehension when manipulating clothing fasteners    Baseline R thumb to index finger opposition at 2.5 cm    Time 3    Period Weeks    Status On-going      OT SHORT TERM GOAL #4   Title Pt will improve wrist flexion/extension by at least 10* to improve participation  w/ IADLs    Baseline R wrist flexion (59*) and extension (50*); L wrist 74* and 78*    Time 3    Period Weeks    Status On-going               OT Long Term Goals - 10/09/20 1144       OT LONG TERM GOAL #1   Title Pt will be independent w/ full HEP designed for ROM and strength of R hand/digits    Baseline No formal HEP at this time    Time 6    Period Weeks    Status On-going      OT LONG TERM GOAL #2   Title Pt will increase AROM of R index finger MCP flexion by at least 14* to improve ability to open medication bottles    Baseline R index MCP 47*    Time 6    Period Weeks    Status On-going      OT LONG TERM GOAL #3   Title Pt will increase AROM of R index finger PIP flexion by greater than 20* to facilitate functional grasp/hold w/ R hand    Baseline R index PIP 30*    Time 6    Period Weeks    Status On-going      OT LONG TERM GOAL #4   Title Pt will demonstrate loose gross grasp w/ R hand to improve participation in functional bilateral tasks    Baseline Unable to achieve full grasp w/ R hand at this time    Time 6    Period Weeks    Status On-going      OT LONG TERM GOAL #5   Title Pt will be able to oppose R thumb to index finger within acceptable level of discomfort to improve pinch prehension during functional FM tasks    Baseline R thumb to index finger opposition at 2.5 cm    Time 6    Period Weeks    Status On-going               Plan - 10/09/20 1620     Clinical Impression Statement OT reviewed HEP w/ pt to ensure good carryover to home; pt returned demo of exercises and OT reviewed isolated PIP and DIP flexion w/ blocking thoroughly due to pt demo'ing composite flexion. Pt also provided note from MD that she meant to bring to eval which stated, "please begin AROM, PROM, AAROM and FDP/S isolation exercise w/ crutches etc. No  restriction to motion." OT observed mild increased edema on palmar side of R wrist and encouraged pt to seek additional  care from her physician with any additional symptoms. Pt did demo increased tension in wrist/wrist flexion during finger exercises, which may be contributing to wrist edema; OT educated pt on importance of maintaining wrist in neutral, relaxed positioning during exercises and pt was receptive.    OT Occupational Profile and History Problem Focused Assessment - Including review of records relating to presenting problem    Occupational performance deficits (Please refer to evaluation for details): ADL's;IADL's;Leisure;Social Participation    Body Structure / Function / Physical Skills ADL;Decreased knowledge of use of DME;Strength;Pain;Dexterity;Body mechanics;Edema;UE functional use;ROM;IADL;Scar mobility;Sensation;Coordination;FMC    Rehab Potential Good    Clinical Decision Making Several treatment options, min-mod task modification necessary    Comorbidities Affecting Occupational Performance: May have comorbidities impacting occupational performance    Modification or Assistance to Complete Evaluation  Min-Moderate modification of tasks or assist with assess necessary to complete eval    OT Frequency 1x / week    OT Duration 6 weeks    OT Treatment/Interventions Self-care/ADL training;Moist Heat;Fluidtherapy;DME and/or AE instruction;Splinting;Therapeutic activities;Ultrasound;Therapeutic exercise;Scar mobilization;Passive range of motion;Cryotherapy;Electrical Stimulation;Paraffin;Manual Therapy;Patient/family education    Plan Review goals w/ pt; review HEP; AAROM    Consulted and Agree with Plan of Care Patient             Patient will benefit from skilled therapeutic intervention in order to improve the following deficits and impairments:   Body Structure / Function / Physical Skills: ADL, Decreased knowledge of use of DME, Strength, Pain, Dexterity, Body mechanics, Edema, UE functional use, ROM, IADL, Scar mobility, Sensation, Coordination, [facility]       Visit Diagnosis: Stiffness of  right hand, not elsewhere classified  Muscle weakness (generalized)  Localized edema  Other symptoms and signs involving the musculoskeletal system    Problem List Patient Active Problem List   Diagnosis Date Noted   Tenosynovitis of right hand 09/21/2020   Osteopenia 06/30/2020   Arrhythmia 05/24/2020   Depression    Essential hypertension 08/30/2019   Overweight 08/30/2019   Paroxysmal atrial fibrillation (HCC) 07/22/2019   Hyperlipidemia 07/22/2019   Obesity (BMI 35.0-39.9 without comorbidity) 07/22/2019   Grief at loss of child 07/22/2019   GERD (gastroesophageal reflux disease) 10/13/2015   Recurrent major depressive disorder, in full remission (HCC) 10/13/2015     Beth Arroyo, OTR/L, MSOT  10/10/2020, 12:51 PM  [facility] [address] [place], [place], [address] Phone: [phone]   Fax:  [phone]  Name: Beth Arroyo MRN: [id number] Date of Birth: 1955-09-05

## 2020-10-12 ENCOUNTER — Encounter: Payer: Self-pay | Admitting: Internal Medicine

## 2020-10-12 ENCOUNTER — Other Ambulatory Visit: Payer: Self-pay

## 2020-10-12 ENCOUNTER — Ambulatory Visit (INDEPENDENT_AMBULATORY_CARE_PROVIDER_SITE_OTHER): Payer: Medicare PPO | Admitting: Internal Medicine

## 2020-10-12 DIAGNOSIS — N760 Acute vaginitis: Secondary | ICD-10-CM | POA: Insufficient documentation

## 2020-10-12 MED ORDER — FLUCONAZOLE 100 MG PO TABS: 100.0000 mg | ORAL_TABLET | Freq: Every day | ORAL | 0 refills | Status: DC

## 2020-10-12 NOTE — Progress Notes (Signed)
Virtual Visit via Telephone Note  I connected with Beth Arroyo on 10/12/20 at 10:00 AM EDT by telephone and verified that I am speaking with the correct person using two identifiers.  Location: Patient: Home Provider: [facility]   I discussed the limitations, risks, security and privacy concerns of performing an evaluation and management service by telephone and the availability of in person appointments. I also discussed with the patient that there may be a patient responsible charge related to this service. The patient expressed understanding and agreed to proceed.   History of Present Illness: I called and spoke with Beth Arroyo today.  She remains on amoxicillin clavulanate therapy following surgery on 09/19/2020 for presumed infectious tenosynovitis of her right hand.  She is not having any problems tolerating her antibiotic well other than recently developing some vaginal itching.  She is concerned that she is developing a fungal infection she still has a little bit of swelling in her hand but overall feels like she is doing much better.  She is progressing slowly with therapy.  She says that her wound is healing well.   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  She will complete 1 more week of therapy.  She follows up with Dr. Amanda Pea, her hand surgeon in 10 days.  Follow Up Instructions: Continue amoxicillin clavulanate for 1 more week Fluconazole 100 mg daily for 3 days Follow-up on 10/25/2020   I discussed the assessment and treatment plan with the patient. The patient was provided an opportunity to ask questions and all were answered. The patient agreed with the plan and demonstrated an understanding of the instructions.   The patient was advised  to call back or seek an in-person evaluation if the symptoms worsen or if the condition fails to improve as anticipated.  I provided 17 minutes of non-face-to-face time during this encounter.   Cliffton Asters, MD

## 2020-10-18 ENCOUNTER — Encounter: Payer: Medicare PPO | Admitting: Occupational Therapy

## 2020-10-19 ENCOUNTER — Other Ambulatory Visit: Payer: Self-pay

## 2020-10-19 ENCOUNTER — Encounter: Payer: Self-pay | Admitting: Occupational Therapy

## 2020-10-19 ENCOUNTER — Ambulatory Visit: Payer: Medicare PPO | Admitting: Occupational Therapy

## 2020-10-19 DIAGNOSIS — M25641 Stiffness of right hand, not elsewhere classified: Secondary | ICD-10-CM | POA: Diagnosis not present

## 2020-10-19 DIAGNOSIS — R600 Localized edema: Secondary | ICD-10-CM

## 2020-10-19 DIAGNOSIS — R29898 Other symptoms and signs involving the musculoskeletal system: Secondary | ICD-10-CM | POA: Diagnosis not present

## 2020-10-19 DIAGNOSIS — M6281 Muscle weakness (generalized): Secondary | ICD-10-CM | POA: Diagnosis not present

## 2020-10-20 NOTE — Therapy (Signed)
[place] [place], [place], [address] Phone: [phone]   Fax:  [phone]  Occupational Therapy Treatment  Patient Details  Name: Beth Arroyo MRN: [id number] Date of Birth: 1956-03-05 Referring Provider (OT): Roseanne Kaufman, MD Rosanne Gutting)   Encounter Date: 10/19/2020   OT End of Session - 10/19/20 1037     Visit Number 3    Number of Visits 7    Date for OT Re-Evaluation 12/31/20    Authorization Type [employer]    Authorization Time Period VL: 3/7 ($20 copay)    Progress Note Due on Visit 10    OT Start Time 1024   pt arrived late   OT Stop Time 1100    OT Time Calculation (min) 36 min    Activity Tolerance Patient tolerated treatment well    Behavior During Therapy [facility] for tasks assessed/performed             Past Medical History:  Diagnosis Date   Arrhythmia 05/24/2020   Depression    Essential hypertension    GERD (gastroesophageal reflux disease) 10/13/2015   Grief at loss of child 07/22/2019   Hyperlipidemia    Obesity (BMI 35.0-39.9 without comorbidity) 07/22/2019   Overweight 08/30/2019   Paroxysmal atrial fibrillation ([place]) 07/22/2019   Recurrent major depressive disorder, in full remission ([place]) 10/13/2015    Past Surgical History:  Procedure Laterality Date   CESAREAN SECTION  1988    There were no vitals filed for this visit.   Subjective Assessment - 10/19/20 1029     Subjective  Pt reports her hand feels tender to touch; wrist continues to feel swollen; follow-up w/ Dr. Amedeo Plenty Monday (10/23/20) at 10am    Pertinent History R carpal tunnel release and flexor tendon (FDP and FDS) synovectomy for "extensive tenosynovitis" of R index finger; depression; arrhythmia    Patient Stated Goals Functional use of R hand    Currently in Pain? Yes    Pain Location Other (Comment)   wrist and hand   Pain Orientation Right    Pain Descriptors / Indicators Sore               [facility] OT Assessment - 10/19/20 1039       Edema   Edema R index proximal phalanx 6.7 cm (L at 6.1 cm); R wrist at crease 15.4 cm (L at 14.8 cm)   Also observed edema (decreased wrinkles) in region of R index finger MCPJ     AROM   Right Wrist Extension 55 Degrees    Right Wrist Flexion 66 Degrees      Right Hand AROM   R Thumb Opposition to Index 0 cm   Able to oppose thumb to index w/out difficulty   R Index  MCP 0-90 67 Degrees    R Index PIP 0-100 64 Degrees    R Index DIP 0-70 26 Degrees   Compostive finger flexion; unable to complete isolated finger PIP/DIP flexion     Right Hand PROM   R Index  MCP 0-90 87 Degrees    R Index PIP 0-100 92 Degrees    R Index DIP 0-70 52 Degrees             OT Treatment/Exercises AROM of composite finger flexion of all digits into fist, focusing on PIP and DIP flexion each rep  Unscrewing/re-screwing balls (small to medium) on [employer] MVP balance system rack to simulate opening and sealing differently sized containers; pt  was able to unscrew loosely secured balls and tighten 4/4 balls w/ min difficulty, but experienced limitations manipulating w/ increased tightness    OT Short Term Goals - 10/19/20 1109       OT SHORT TERM GOAL #1   Title Pt will increase AROM of R index MCP, PIP, and DIP flexion by at least 9* to facilitate functional grasp w/ R hand    Baseline R index MCP 47*, PIP 30*, DIP 19*    Time 3    Period Weeks    Status Partially Met   10/19/20 - MCP +20*; PIP +34*; DIP +7*   Target Date 10/27/20      OT SHORT TERM GOAL #2   Title Pt will be able to open medium-large containers using both hands (and AE prn) 100% of the time w/out pain    Baseline Reports difficulty opening containers due to decreased functional use of R hand    Time 3    Period Weeks    Status On-going    Target Date 10/27/20      OT SHORT TERM GOAL #3   Title Pt will decrease R thumb to index finger opposition to at least 1.5 cm to improve functional  pinch prehension when manipulating clothing fasteners    Baseline R thumb to index finger opposition at 2.5 cm    Time 3    Period Weeks    Status Achieved   10/19/20 - able to completely oppose thumb to index finger     OT SHORT TERM GOAL #4   Title Pt will improve wrist flexion/extension by at least 10* to improve participation w/ IADLs    Baseline R wrist flexion (59*) and extension (50*); L wrist 74* and 78*    Time 3    Period Weeks    Status On-going      OT SHORT TERM GOAL #5   Title Pt will increase AROM of R index finger PIP flexion by at least 9* to facilitate functional grasp w/ R hand    Baseline 10/19/20 - 64 degrees of PIP flexion    Time 2    Period Weeks    Status New    Target Date 11/03/20               OT Long Term Goals - 10/19/20 1114       OT LONG TERM GOAL #1   Title Pt will be independent w/ full HEP designed for ROM and strength of R hand/digits    Baseline No formal HEP at this time    Time 6    Period Weeks    Status On-going      OT LONG TERM GOAL #2   Title Pt will increase AROM of R index finger MCP flexion by at least 14* to improve ability to open medication bottles    Baseline R index MCP 47*    Time 6    Period Weeks    Status Achieved   10/19/20 - MCP flexion at 67 degrees     OT LONG TERM GOAL #3   Title Pt will increase AROM of R index finger PIP flexion by greater than 20* to facilitate functional grasp/hold w/ R hand    Baseline R index PIP 30*    Time 6    Period Weeks    Status Achieved   10/19/20 - PIP flexion at 64 degrees     OT LONG TERM GOAL #4   Title Pt  will demonstrate loose gross grasp w/ R hand to improve participation in functional bilateral tasks    Baseline Unable to achieve full grasp w/ R hand at this time    Time 6    Period Weeks    Status On-going      OT LONG TERM GOAL #5   Title Pt will be able to oppose R thumb to index finger within acceptable level of discomfort to improve pinch prehension during  functional FM tasks    Baseline R thumb to index finger opposition at 2.5 cm    Time 6    Period Weeks    Status Achieved   10/19/20 - able to completely oppose thumb to index finger             Plan - 10/19/20 1037     Clinical Impression Statement Completed re-assessment for AROM of R index finger, edema in region of proximal wrist crease, and thumb-to-finger opposition, w/ OT providing interpretation of results and progress since previous measurement. Pt has significantly improved AROM of MCP and PIP flexion, but continues to experience limitations at DIPJ and reports continued difficulty/discomfort w/ functional activities. Due to this, OT grossly assessed strength, including simulation of opening/tightening containers, which indicated decreased strength and limitations w/ functional bilateral tasks. Pt has a follow-up appt w/ Dr. Amedeo Plenty on Monday and POC may be adjusted according to recommendations.   OT Occupational Profile and History Problem Focused Assessment - Including review of records relating to presenting problem    Occupational performance deficits (Please refer to evaluation for details): ADL's;IADL's;Leisure;Social Participation    Body Structure / Function / Physical Skills ADL;Decreased knowledge of use of DME;Strength;Pain;Dexterity;Body mechanics;Edema;UE functional use;ROM;IADL;Scar mobility;Sensation;Coordination;FMC    Rehab Potential Good    Clinical Decision Making Several treatment options, min-mod task modification necessary    Comorbidities Affecting Occupational Performance: May have comorbidities impacting occupational performance    Modification or Assistance to Complete Evaluation  Min-Moderate modification of tasks or assist with assess necessary to complete eval    OT Frequency 1x / week    OT Duration 6 weeks    OT Treatment/Interventions Self-care/ADL training;Moist Heat;Fluidtherapy;DME and/or AE instruction;Splinting;Therapeutic  activities;Ultrasound;Therapeutic exercise;Scar mobilization;Passive range of motion;Cryotherapy;Electrical Stimulation;Paraffin;Manual Therapy;Patient/family education    Plan Update or continue POC based on MD recommendation   Consulted and Agree with Plan of Care Patient             Patient will benefit from skilled therapeutic intervention in order to improve the following deficits and impairments:   Body Structure / Function / Physical Skills: ADL, Decreased knowledge of use of DME, Strength, Pain, Dexterity, Body mechanics, Edema, UE functional use, ROM, IADL, Scar mobility, Sensation, Coordination, [facility]       Visit Diagnosis: Stiffness of right hand, not elsewhere classified  Localized edema  Other symptoms and signs involving the musculoskeletal system    Problem List Patient Active Problem List   Diagnosis Date Noted   Vaginitis 10/12/2020   Tenosynovitis of right hand 09/21/2020   Osteopenia 06/30/2020   Arrhythmia 05/24/2020   Depression    Essential hypertension 08/30/2019   Overweight 08/30/2019   Paroxysmal atrial fibrillation ([place]) 07/22/2019   Hyperlipidemia 07/22/2019   Obesity (BMI 35.0-39.9 without comorbidity) 07/22/2019   Grief at loss of child 07/22/2019   GERD (gastroesophageal reflux disease) 10/13/2015   Recurrent major depressive disorder, in full remission ([place]) 10/13/2015     Kathrine Cords, OTR/L, MSOT 10/20/2020, 6:11 PM  [place]  [address] [place], [place], [address] Phone: [phone]   Fax:  [phone]  Name: Beth Arroyo MRN: [id number] Date of Birth: [birth year]

## 2020-10-23 ENCOUNTER — Ambulatory Visit: Payer: Medicare PPO | Admitting: Occupational Therapy

## 2020-10-23 ENCOUNTER — Encounter: Payer: Self-pay | Admitting: Occupational Therapy

## 2020-10-23 ENCOUNTER — Other Ambulatory Visit: Payer: Self-pay

## 2020-10-23 DIAGNOSIS — M6281 Muscle weakness (generalized): Secondary | ICD-10-CM

## 2020-10-23 DIAGNOSIS — M25641 Stiffness of right hand, not elsewhere classified: Secondary | ICD-10-CM

## 2020-10-23 DIAGNOSIS — R600 Localized edema: Secondary | ICD-10-CM

## 2020-10-23 DIAGNOSIS — R29898 Other symptoms and signs involving the musculoskeletal system: Secondary | ICD-10-CM

## 2020-10-23 NOTE — Patient Instructions (Addendum)
Access Code: [email or web address] URL: [email or web address] Date: 10/23/2020 Prepared by: Adelina Mings, OTR/L  Exercises Seated Wrist Flexor Hook Fist REPS: 10  SETS: 2  DAILY: 3 Begin sitting upright with your arm resting on a table at your side and your palm up. Slowly bend the top two knuckles of your fingers toward your palm. Hold briefly, then return to the starting position and repeat. Tip: Make sure to keep the rest of your arm relaxed   Seated Finger PIP Flexion AROM REPS: 10  SETS: 2  DAILY: 3 Begin sitting upright with your arm resting on a table, hand flat and palm facing up. Your other hand should be holding your uninvolved fingers flat on the table. Bend your involved finger upward, moving only at the your middle joint of your finger. Slowly return to the starting position and repeat. Tip: Make sure to focus on only bending at your the middle joint of your finger. Keep the rest of your arm relaxed during the exercise.  Finger PIP Flexion Extension with Blocking REPS: 10  SETS: 2  DAILY: 3 Begin sitting upright with your elbow resting on a table, holding the base section of your involved finger with your fingers of your other hand, keeping the middle joint free. Bend your involved finger at the middle joint, then slowly straighten, and repeat. Tip: Make sure to move only the middle joint of the finger and be careful not to over extend the bottom knuckle.   Seated Finger DIP Flexion AROM with Blocking REPS: 10  SETS: 2  DAILY: 3 Begin sitting upright with your arm resting on a table and your palm up. You should be holding the middle section of your involved finger with your other hand. Bend the end joint of your finger, then slowly relax and repeat. Tip: Make sure to focus on only bending at the end joint of your involved finger. Keep the rest of your arm relaxed during the exercise.   Seated Finger Composite Flexion Stretch REPS: 5  SETS: 2  HOLD: 3 sec  DAILY:  3 Begin sitting upright with your hands resting on a table in front of you, and your involved hand palm up. Use the fingers of your other hand to bend all the joints of your involved finger, bending at your end, middle, and base joints. Then slowly uncurl your finger and repeat. Tip: Make sure to keep the rest of your arm relaxed during the exercise.   Putty Squeezes REPS: 10  SETS: 2  DAILY: 3 Begin sitting with your hand in a comfortable position, holding putty in your palm. Gently squeeze the putty using all of your fingers equally, and repeat. Tip: Make sure to keep the rest of your arm relaxed during the movement.   Finger Pinch and Pull with Putty REPS: 10  SETS: 2  DAILY: 3 Begin sitting upright in a chair holding the ends of a roll of putty in each hand with the tips of your fingers. Pinch the putty with your fingers and pull your hands apart to stretch the putty. Reshape the putty and repeat this movement. Tip: Make sure to keep your wrists straight and your shoulders relaxed during the exercise.   Tip Pinch with Putty SETS: 5-10  DAILY: 3 Begin sitting upright with a roll of putty on the table. Using the tips of your index finger and thumb, gently pinch into the putty. Tip: Make sure to keep your wrist straight and only move through a pain free range.

## 2020-10-23 NOTE — Therapy (Signed)
[facility] [place], [place], [address] Phone: [phone]   Fax:  [phone]  Occupational Therapy Treatment  Patient Details  Name: Beth Arroyo MRN: [id number] Date of Birth: 1956-02-01 Referring Provider (OT): Roseanne Kaufman, MD Rosanne Gutting)   Encounter Date: 10/23/2020   OT End of Session - 10/23/20 1520     Visit Number 4    Number of Visits 7    Date for OT Re-Evaluation 12/31/20    Authorization Type [employer]    Authorization Time Period VL: 4/7 ($20 copay)    Progress Note Due on Visit 10    OT Start Time 1401    OT Stop Time 1453    OT Time Calculation (min) 52 min    Activity Tolerance Patient tolerated treatment well    Behavior During Therapy [facility] for tasks assessed/performed             Past Medical History:  Diagnosis Date   Arrhythmia 05/24/2020   Depression    Essential hypertension    GERD (gastroesophageal reflux disease) 10/13/2015   Grief at loss of child 07/22/2019   Hyperlipidemia    Obesity (BMI 35.0-39.9 without comorbidity) 07/22/2019   Overweight 08/30/2019   Paroxysmal atrial fibrillation ([place]) 07/22/2019   Recurrent major depressive disorder, in full remission ([place]) 10/13/2015    Past Surgical History:  Procedure Laterality Date   CESAREAN SECTION  1988    There were no vitals filed for this visit.   Subjective Assessment - 10/23/20 1403     Subjective  Pt states her follow-up w/ Dr. Amedeo Plenty went well this morning; per her report, he would like her to start using putty, states persistent edema is expected, and believes that decreased ROM at PIP/DIP is likely due to increased scar tissue 2/2 infection and FDP/FDS synovectomy    Pertinent History R carpal tunnel release and flexor tendon (FDP and FDS) synovectomy for "extensive tenosynovitis" of R index finger; depression; arrhythmia    Patient Stated Goals Functional use of R hand    Currently in Pain? No/denies    Pt reports "general soreness" in her wrist, thumb, and index finger            OT Treatment/Exercises - 10/23/20 1531 Using R hand pincer grasp to pick up 1" blocks and stack them; pt demo'd pad to pad prehension and was able to complete activity w/out drops. OT graded activity up to placing small pegs into pegboard w/ pt experiencing increased difficulty and multiple drops  R thumb to index finger opposition along length of putty completed 10x; pad-to-pad prehension demo'd vs true pincer grasp w/ DIP flexion limited  Full gross grasp of putty w/ R hand 15x, focusing on incorporating PIP and DIP flexion each rep. OT provided education on maintaining wrist in neutral and focusing on attempting movement pattern against resistance opposed to straining to force movement  Pinch and pull using putty 10x; pt demo'd difficulty pulling w/ pinch prehension against resistance  Stringing beads onto thread using R hand to facilitate improved pincer grasp and PIP/DIP flexion in functional activities  R hook fist w/ OT providing modeling and verbal cues for appropriate alignment during exercise  R index finger PIP flexion w/ joint blocking; OT provided blocking for initial reps, w/ pt self-blocking for remainder    OT Short Term Goals - 10/19/20 1109       OT SHORT TERM GOAL #1   Title Pt will increase AROM of R  index MCP, PIP, and DIP flexion by at least 9* to facilitate functional grasp w/ R hand    Baseline R index MCP 47*, PIP 30*, DIP 19*    Time 3    Period Weeks    Status Partially Met   10/19/20 - MCP +20*; PIP +34*; DIP +7*   Target Date 10/27/20      OT SHORT TERM GOAL #2   Title Pt will be able to open medium-large containers using both hands (and AE prn) 100% of the time w/out pain    Baseline Reports difficulty opening containers due to decreased functional use of R hand    Time 3    Period Weeks    Status On-going    Target Date 10/27/20      OT SHORT TERM GOAL #3   Title Pt  will decrease R thumb to index finger opposition to at least 1.5 cm to improve functional pinch prehension when manipulating clothing fasteners    Baseline R thumb to index finger opposition at 2.5 cm    Time 3    Period Weeks    Status Achieved   10/19/20 - able to completely oppose thumb to index finger     OT SHORT TERM GOAL #4   Title Pt will improve wrist flexion/extension by at least 10* to improve participation w/ IADLs    Baseline R wrist flexion (59*) and extension (50*); L wrist 74* and 78*    Time 3    Period Weeks    Status On-going      OT SHORT TERM GOAL #5   Title Pt will increase AROM of R index finger PIP flexion by at least 9* to facilitate functional grasp w/ R hand    Baseline 10/19/20 - 64 degrees of PIP flexion    Time 2    Period Weeks    Status New    Target Date 11/03/20              OT Long Term Goals - 10/19/20 1114       OT LONG TERM GOAL #1   Title Pt will be independent w/ full HEP designed for ROM and strength of R hand/digits    Baseline No formal HEP at this time    Time 6    Period Weeks    Status On-going      OT LONG TERM GOAL #2   Title Pt will increase AROM of R index finger MCP flexion by at least 14* to improve ability to open medication bottles    Baseline R index MCP 47*    Time 6    Period Weeks    Status Achieved   10/19/20 - MCP flexion at 67 degrees     OT LONG TERM GOAL #3   Title Pt will increase AROM of R index finger PIP flexion by greater than 20* to facilitate functional grasp/hold w/ R hand    Baseline R index PIP 30*    Time 6    Period Weeks    Status Achieved   10/19/20 - PIP flexion at 64 degrees     OT LONG TERM GOAL #4   Title Pt will demonstrate loose gross grasp w/ R hand to improve participation in functional bilateral tasks    Baseline Unable to achieve full grasp w/ R hand at this time    Time 6    Period Weeks    Status On-going      OT LONG  TERM GOAL #5   Title Pt will be able to oppose R thumb to  index finger within acceptable level of discomfort to improve pinch prehension during functional FM tasks    Baseline R thumb to index finger opposition at 2.5 cm    Time 6    Period Weeks    Status Achieved   10/19/20 - able to completely oppose thumb to index finger             Plan - 10/23/20 1521     Clinical Impression Statement Due to pt's report that Dr. Amedeo Plenty provided medical clearance to initiate light strengthening, OT introduced putty exercises this session. Pt completed gross grasp, thumb to index finger opposition, and pinch-and-pull exercises w/ min difficulty, requiring some verbal cueing and modeling for success. Decreased DIP flexion is very limiting w/ very slight active flexion observed during isolated, joint blocking exercises. OT also reviewed HEP in full, ensuring good alignment during exercises, updated handout, and administered yellow putty to pt.    OT Occupational Profile and History Problem Focused Assessment - Including review of records relating to presenting problem    Occupational performance deficits (Please refer to evaluation for details): ADL's;IADL's;Leisure;Social Participation    Body Structure / Function / Physical Skills ADL;Decreased knowledge of use of DME;Strength;Pain;Dexterity;Body mechanics;Edema;UE functional use;ROM;IADL;Scar mobility;Sensation;Coordination;FMC    Rehab Potential Good    Clinical Decision Making Several treatment options, min-mod task modification necessary    Comorbidities Affecting Occupational Performance: May have comorbidities impacting occupational performance    Modification or Assistance to Complete Evaluation  Min-Moderate modification of tasks or assist with assess necessary to complete eval    OT Frequency 1x / week    OT Duration 6 weeks    OT Treatment/Interventions Self-care/ADL training;Moist Heat;Fluidtherapy;DME and/or AE instruction;Splinting;Therapeutic activities;Ultrasound;Therapeutic exercise;Scar  mobilization;Passive range of motion;Cryotherapy;Electrical Stimulation;Paraffin;Manual Therapy;Patient/family education    Plan Isolated PIP flexion/extension; scar massage    Consulted and Agree with Plan of Care Patient             Patient will benefit from skilled therapeutic intervention in order to improve the following deficits and impairments:   Body Structure / Function / Physical Skills: ADL, Decreased knowledge of use of DME, Strength, Pain, Dexterity, Body mechanics, Edema, UE functional use, ROM, IADL, Scar mobility, Sensation, Coordination, [facility]       Visit Diagnosis: Other symptoms and signs involving the musculoskeletal system  Muscle weakness (generalized)  Stiffness of right hand, not elsewhere classified  Localized edema    Problem List Patient Active Problem List   Diagnosis Date Noted   Vaginitis 10/12/2020   Tenosynovitis of right hand 09/21/2020   Osteopenia 06/30/2020   Arrhythmia 05/24/2020   Depression    Essential hypertension 08/30/2019   Overweight 08/30/2019   Paroxysmal atrial fibrillation ([place]) 07/22/2019   Hyperlipidemia 07/22/2019   Obesity (BMI 35.0-39.9 without comorbidity) 07/22/2019   Grief at loss of child 07/22/2019   GERD (gastroesophageal reflux disease) 10/13/2015   Recurrent major depressive disorder, in full remission ([place]) 10/13/2015     Kathrine Cords, OTR/L, MSOT  10/23/2020, 3:30 PM  [facility]. [place], [place], [address] Phone: [phone]   Fax:  [phone]  Name: Beth Arroyo MRN: [id number] Date of Birth: 1955-08-07

## 2020-10-25 ENCOUNTER — Telehealth (INDEPENDENT_AMBULATORY_CARE_PROVIDER_SITE_OTHER): Payer: Medicare PPO | Admitting: Internal Medicine

## 2020-10-25 ENCOUNTER — Encounter: Payer: Self-pay | Admitting: Internal Medicine

## 2020-10-25 ENCOUNTER — Other Ambulatory Visit: Payer: Self-pay

## 2020-10-25 DIAGNOSIS — M659 Synovitis and tenosynovitis, unspecified: Secondary | ICD-10-CM

## 2020-10-25 NOTE — Progress Notes (Signed)
Virtual Visit via Telephone Note  I connected with Denyse Amass on 10/25/20 at 10:45 AM EDT by telephone and verified that I am speaking with the correct person using two identifiers.  Location: Patient: Home Provider: [facility]   I discussed the limitations, risks, security and privacy concerns of performing an evaluation and management service by telephone and the availability of in person appointments. I also discussed with the patient that there may be a patient responsible charge related to this service. The patient expressed understanding and agreed to proceed.   History of Present Illness: I called and spoke with [place] today.  She completed 4 weeks of postoperative amoxicillin clavulanate 5 days ago.  She did not have any problems tolerating her antibiotic.  She feels like she is making slow progress with her physical and occupational therapy for her right hand.  She is regaining some flexibility of her index finger slowly.  She has some residual swelling of her palm but overall this is improving as well.   Observations/Objective:   Assessment and Plan: Her right hand tenosynovitis is improving and appears to have responded to another course of amoxicillin clavulanate.  Suspect that she did have infection, most likely with Pasteurella following a puncture wound from her cat earlier this year.  Routine stains and cultures were negative following surgery on 09/19/2020.  AFB and fungal stains were negative.  AFB and fungal cultures remain negative just over 5 weeks.  Follow Up Instructions: Observe off of antibiotics I have asked our laboratory staff to check on final cultures next week Follow-up in 4 weeks   I discussed the assessment and treatment plan with the patient. The patient was provided an opportunity to ask questions and all were answered. The patient agreed with the plan and demonstrated an understanding of the instructions.   The patient was advised to call back or seek an  in-person evaluation if the symptoms worsen or if the condition fails to improve as anticipated.  I provided 16 minutes of non-face-to-face time during this encounter.   Cliffton Asters, MD

## 2020-11-01 ENCOUNTER — Other Ambulatory Visit: Payer: Self-pay

## 2020-11-01 ENCOUNTER — Ambulatory Visit: Payer: Medicare PPO | Attending: Orthopedic Surgery | Admitting: Occupational Therapy

## 2020-11-01 DIAGNOSIS — R600 Localized edema: Secondary | ICD-10-CM | POA: Diagnosis not present

## 2020-11-01 DIAGNOSIS — M25641 Stiffness of right hand, not elsewhere classified: Secondary | ICD-10-CM

## 2020-11-01 DIAGNOSIS — M6281 Muscle weakness (generalized): Secondary | ICD-10-CM

## 2020-11-01 DIAGNOSIS — R29898 Other symptoms and signs involving the musculoskeletal system: Secondary | ICD-10-CM | POA: Diagnosis not present

## 2020-11-01 NOTE — Patient Instructions (Signed)
Flexor Tendon Gliding (Active Hook Fist)    With fingers and knuckles straight, bend middle and tip joints. Do not bend large knuckles.   Flexor Tendon Gliding (Active Full Fist)    Straighten all fingers, then make a fist, bending all joints.   Flexor Tendon Gliding (Active Straight Fist)    Start with fingers straight. Bend knuckles and middle joints. Keep fingertip joints straight to touch base of palm. Repeat 15 times. Do 2-5 sessions per day.  Copyright  VHI. All rights reserved.

## 2020-11-01 NOTE — Therapy (Signed)
[facility] [address] [place], [place], [address] Phone: [phone]   Fax:  [phone]  Occupational Therapy Treatment  Patient Details  Name: Beth Arroyo MRN: [id number] Date of Birth: August 26, 1955 Referring Provider (OT): Dominica Severin, MD Raechel Chute)   Encounter Date: 11/01/2020   OT End of Session - 11/01/20 1108     Visit Number 5    Number of Visits 7    Date for OT Re-Evaluation 12/31/20    Authorization Type [employer]    Authorization Time Period VL: 4/7 ($20 copay)    Progress Note Due on Visit 10    OT Start Time 1019    OT Stop Time 1100    OT Time Calculation (min) 41 min    Activity Tolerance Patient tolerated treatment well    Behavior During Therapy [facility] for tasks assessed/performed             Past Medical History:  Diagnosis Date   Arrhythmia 05/24/2020   Depression    Essential hypertension    GERD (gastroesophageal reflux disease) 10/13/2015   Grief at loss of child 07/22/2019   Hyperlipidemia    Obesity (BMI 35.0-39.9 without comorbidity) 07/22/2019   Overweight 08/30/2019   Paroxysmal atrial fibrillation (HCC) 07/22/2019   Recurrent major depressive disorder, in full remission (HCC) 10/13/2015    Past Surgical History:  Procedure Laterality Date   CESAREAN SECTION  1988    There were no vitals filed for this visit.   Subjective Assessment - 11/01/20 1022     Subjective  Pt verbalized frustration that she is not able to actively flex R index finger PIP/DIP to facilitate functional grasp and pinch; "it feels like it's just dead"   Pertinent History R carpal tunnel release and flexor tendon (FDP and FDS) synovectomy for "extensive tenosynovitis" of R index finger; depression; arrhythmia    Patient Stated Goals Functional use of R hand    Currently in Pain? No/denies   "It's sore"            [facility] OT Assessment - 11/01/20 1027       Edema   Edema R index proximal phalanx  6.6 cm (L at 6.1 cm); R wrist at crease 15.5 cm (L at 14.8 cm)      Right Hand AROM   R Index  MCP 0-90 78 Degrees    R Index PIP 0-100 82 Degrees   33 isolated   R Index DIP 0-70 33 Degrees   6 isolated     Right Hand PROM   R Index PIP 0-100 89 Degrees    R Index DIP 0-70 52 Degrees      Hand Function   Right Hand Grip (lbs) 12#    Right Hand 3 Point Pinch 5 lbs   2# isolated w/ index   Left Hand Grip (lbs) 41#    Left 3 point pinch 12 lbs             OT Treatment/Exercises - 11/01/20    Isolated PIP Flexion/Extension OT provided blocking of proximal joints to facilitate isolated AROM of R index finger PIPJ flexion/ext; decreased arc of motion w/out MCP involvement in flexion  OT-facilitated blocking to for isolated AROM of R index finger DIPJ flexion/ext; pt unable to complete AROM w/ true isolation, consistently involving index finger MCP and PIP when attempting exercise    Manual Therapy - PROM Composite PIP/DIP flexion w/ slight MCP involvement of R index finger for  increased ROM, decreased stiffness, and soft tissue mobilization. Pt instructed to attempt to hold mid-range position while OT slowly released support w/ pt able to maintain some PIP flexion and no DIP flexion    Tendon Glides Tendon gliding exercises completed for increased ROM and decreasing adhesions; OT provided modeling and verbal cues for positioning         OT Education - 11/01/20 1111     Education Details Tendon gliding w/ hook fist, full fist, and long fist   Person(s) Educated Patient    Methods Explanation;Handout;Demonstration    Comprehension Verbalized understanding;Returned demonstration             OT Short Term Goals - 11/01/20 1030       OT SHORT TERM GOAL #1   Title Pt will increase AROM of R index MCP, PIP, and DIP flexion by at least 9* to facilitate functional grasp w/ R hand    Baseline R index MCP 47*, PIP 30*, DIP 19*    Time 3    Period Weeks    Status Achieved    11/02/20 - MCP +31 degrees, PIP +52, DIP +14; 10/19/20 - MCP +20, PIP +34, DIP +7   Target Date 10/27/20      OT SHORT TERM GOAL #2   Title Pt will be able to open medium-large containers using both hands (and AE prn) 100% of the time w/out pain    Baseline Reports difficulty opening containers due to decreased functional use of R hand    Time 3    Period Weeks    Status On-going    Target Date 10/27/20      OT SHORT TERM GOAL #3   Title Pt will decrease R thumb to index finger opposition to at least 1.5 cm to improve functional pinch prehension when manipulating clothing fasteners    Baseline R thumb to index finger opposition at 2.5 cm    Time 3    Period Weeks    Status Achieved   10/19/20 - able to completely oppose thumb to index finger     OT SHORT TERM GOAL #4   Title Pt will improve wrist flexion/extension by at least 10* to improve participation w/ IADLs    Baseline R wrist flexion (59*) and extension (50*); L wrist 74* and 78*    Time 3    Period Weeks    Status On-going      OT SHORT TERM GOAL #5   Title Pt will increase AROM of R index finger PIP flexion by at least 9* to facilitate functional grasp w/ R hand    Baseline 10/19/20 - 64 degrees of PIP flexion    Time 2    Period Weeks    Status Achieved   11/02/20 - 82 degrees of flexion (+18*)   Target Date 11/03/20             OT Long Term Goals - 10/19/20 1114       OT LONG TERM GOAL #1   Title Pt will be independent w/ full HEP designed for ROM and strength of R hand/digits    Baseline No formal HEP at this time    Time 6    Period Weeks    Status On-going      OT LONG TERM GOAL #2   Title Pt will increase AROM of R index finger MCP flexion by at least 14* to improve ability to open medication bottles    Baseline R index  MCP 47*    Time 6    Period Weeks    Status Achieved   10/19/20 - MCP flexion at 67 degrees     OT LONG TERM GOAL #3   Title Pt will increase AROM of R index finger PIP flexion by greater  than 20* to facilitate functional grasp/hold w/ R hand    Baseline R index PIP 30*    Time 6    Period Weeks    Status Achieved   10/19/20 - PIP flexion at 64 degrees     OT LONG TERM GOAL #4   Title Pt will demonstrate loose gross grasp w/ R hand to improve participation in functional bilateral tasks    Baseline Unable to achieve full grasp w/ R hand at this time    Time 6    Period Weeks    Status On-going      OT LONG TERM GOAL #5   Title Pt will be able to oppose R thumb to index finger within acceptable level of discomfort to improve pinch prehension during functional FM tasks    Baseline R thumb to index finger opposition at 2.5 cm    Time 6    Period Weeks    Status Achieved   10/19/20 - able to completely oppose thumb to index finger            Plan - 11/01/20 1109     Clinical Impression Statement Pt has progressed toward functional goals, but continues to be limited by edema and lack of strength in R index finger, particularly in PIP and DIP flexion. OT provided education regarding improvements and POC moving forward. Due to pt's compliance and carryover w/ HEP, OT discussed potentially placing pt on hold for approx. 2 weeks to re-assess after follow-up w/ hand surgeon; pt was agreeable.   OT Occupational Profile and History Problem Focused Assessment - Including review of records relating to presenting problem    Occupational performance deficits (Please refer to evaluation for details): ADL's;IADL's;Leisure;Social Participation    Body Structure / Function / Physical Skills ADL;Decreased knowledge of use of DME;Strength;Pain;Dexterity;Body mechanics;Edema;UE functional use;ROM;IADL;Scar mobility;Sensation;Coordination;FMC    Rehab Potential Good    Clinical Decision Making Several treatment options, min-mod task modification necessary    Comorbidities Affecting Occupational Performance: May have comorbidities impacting occupational performance    Modification or Assistance  to Complete Evaluation  Min-Moderate modification of tasks or assist with assess necessary to complete eval    OT Frequency 1x / week    OT Duration 6 weeks    OT Treatment/Interventions Self-care/ADL training;Moist Heat;Fluidtherapy;DME and/or AE instruction;Splinting;Therapeutic activities;Ultrasound;Therapeutic exercise;Scar mobilization;Passive range of motion;Cryotherapy;Electrical Stimulation;Paraffin;Manual Therapy;Patient/family education    Plan Message MD regarding modalities; continue to facilitate full gross grasp/grip strength   Consulted and Agree with Plan of Care Patient             Patient will benefit from skilled therapeutic intervention in order to improve the following deficits and impairments:   Body Structure / Function / Physical Skills: ADL, Decreased knowledge of use of DME, Strength, Pain, Dexterity, Body mechanics, Edema, UE functional use, ROM, IADL, Scar mobility, Sensation, Coordination, [facility]   Visit Diagnosis: Other symptoms and signs involving the musculoskeletal system  Muscle weakness (generalized)  Stiffness of right hand, not elsewhere classified  Localized edema    Problem List Patient Active Problem List   Diagnosis Date Noted   Vaginitis 10/12/2020   Tenosynovitis of right hand 09/21/2020   Osteopenia 06/30/2020  Arrhythmia 05/24/2020   Depression    Essential hypertension 08/30/2019   Overweight 08/30/2019   Paroxysmal atrial fibrillation (HCC) 07/22/2019   Hyperlipidemia 07/22/2019   Obesity (BMI 35.0-39.9 without comorbidity) 07/22/2019   Grief at loss of child 07/22/2019   GERD (gastroesophageal reflux disease) 10/13/2015   Recurrent major depressive disorder, in full remission (HCC) 10/13/2015     Rosie Fate, OTR/L, MSOT  11/01/2020, 11:00 AM  [facility] [address] [place], [place], [address] Phone: [phone]   Fax:  [phone]  Name: Beth Arroyo MRN:  [id number] Date of Birth: [birth year]

## 2020-11-02 ENCOUNTER — Telehealth: Payer: Self-pay | Admitting: Internal Medicine

## 2020-11-08 ENCOUNTER — Telehealth: Payer: Self-pay | Admitting: Internal Medicine

## 2020-11-08 NOTE — Telephone Encounter (Signed)
I received an updated report on Beth Arroyo's operative cultures today.  [facility] has isolated an acid-fast bacilli 6 weeks and 6 days following surgery on 09/19/2020.  Species identification and susceptibility testing are pending.  I called and spoke with Beth Arroyo today.  She still has some swelling in her right hand and index finger and some restricted range of motion but overall this is steadily improving with time and physical therapy.  She has had no new areas of swelling, redness or pain.  I now strongly suspect that she had a mycobacterial infection.  This would fit with the noncaseating granulomatous inflammation seen on pathology and with Beth Arroyo excellent operative assessment.  Unfortunately mycobacteria are such a diverse group of organisms that it is extremely difficult to choose a reasonable empiric antibiotic regimen based only on the AFB smear.  Since Beth Arroyo is continuing to improve there is a chance that her infection was treated and cured by surgery alone.  I will continue observation off of antibiotics and await final speciation of her isolate.

## 2020-11-09 ENCOUNTER — Telehealth: Payer: Self-pay | Admitting: Occupational Therapy

## 2020-11-09 ENCOUNTER — Ambulatory Visit: Payer: Medicare PPO | Admitting: Occupational Therapy

## 2020-11-10 NOTE — Telephone Encounter (Signed)
In occupational therapy session on 11/01/20, OT and pt discussed potentially going on hold for therapy in order to reassess POC after upcoming appointments with infectious disease and orthopedic physicians.  I called and spoke with Ms. Sutphen today to follow up on this before her scheduled appt today. She reports continued compliance with her HEP as well as persistent edema and restricted AROM/strength of R index finger and palmar region of her R hand/wrist. Due to this, pt is agreeable to OT contacting her orthopedic surgeon regarding add'l treatment options for therapy prior to returning to OT at this time; scheduled appt for today was cancelled.  Rosie Fate, OTR/L, MSOT 11/09/2020, 8:40 AM  [facility] [address] [place]. [place], [place] [address] Phone: [phone]

## 2020-11-15 ENCOUNTER — Ambulatory Visit: Payer: Medicare PPO | Admitting: Occupational Therapy

## 2020-11-23 ENCOUNTER — Telehealth (INDEPENDENT_AMBULATORY_CARE_PROVIDER_SITE_OTHER): Payer: Medicare PPO | Admitting: Internal Medicine

## 2020-11-23 ENCOUNTER — Ambulatory Visit: Payer: Medicare PPO | Admitting: Occupational Therapy

## 2020-11-23 ENCOUNTER — Other Ambulatory Visit: Payer: Self-pay

## 2020-11-23 ENCOUNTER — Encounter: Payer: Self-pay | Admitting: Internal Medicine

## 2020-11-23 DIAGNOSIS — M659 Synovitis and tenosynovitis, unspecified: Secondary | ICD-10-CM

## 2020-11-23 NOTE — Progress Notes (Signed)
Virtual Visit via Telephone Note  I connected with Beth Arroyo on 11/23/20 at  9:30 AM EDT by telephone and verified that I am speaking with the correct person using two identifiers.  Location: Patient: Home Provider: [facility]   I discussed the limitations, risks, security and privacy concerns of performing an evaluation and management service by telephone and the availability of in person appointments. I also discussed with the patient that there may be a patient responsible charge related to this service. The patient expressed understanding and agreed to proceed.   History of Present Illness: I called and spoke with Beth Arroyo today.  She has not had any new swelling, redness or pain in her right hand.  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  She met with her surgeon, Dr. Laurelyn Sickle, yesterday and she tells me that he encouraged her to consider treatment to reduce the chance that her infection could come back.   Observations/Objective:   Assessment and Plan: She is stable without signs of obvious relapsing infection but I agree with her concerns about the possibility that she could have very low-grade infection with Mycobacterium szulgai that could relapse in the near future.  Follow Up Instructions: She will come in for an in person visit on 11/30/2020 so I can examine her hand and discuss the 3 drug antibiotic regimen consisting of azithromycin, ethambutol and rifampin.   I discussed the assessment and treatment plan with the patient. The patient was provided an opportunity to ask questions and all were answered. The patient agreed with the plan and demonstrated an understanding of the instructions.   The patient was advised to call back or seek an in-person evaluation if the symptoms worsen or if the condition fails to improve as anticipated.  I provided 14 minutes of non-face-to-face time during this encounter.   Michel Bickers, MD

## 2020-11-29 ENCOUNTER — Ambulatory Visit: Payer: Medicare PPO | Admitting: Occupational Therapy

## 2020-11-29 DIAGNOSIS — H2513 Age-related nuclear cataract, bilateral: Secondary | ICD-10-CM | POA: Diagnosis not present

## 2020-11-29 DIAGNOSIS — H35363 Drusen (degenerative) of macula, bilateral: Secondary | ICD-10-CM | POA: Diagnosis not present

## 2020-11-29 DIAGNOSIS — H524 Presbyopia: Secondary | ICD-10-CM | POA: Diagnosis not present

## 2020-11-29 DIAGNOSIS — H35033 Hypertensive retinopathy, bilateral: Secondary | ICD-10-CM | POA: Diagnosis not present

## 2020-11-29 DIAGNOSIS — A318 Other mycobacterial infections: Secondary | ICD-10-CM

## 2020-11-29 HISTORY — DX: Other mycobacterial infections: A31.8

## 2020-11-30 ENCOUNTER — Encounter: Payer: Self-pay | Admitting: Internal Medicine

## 2020-11-30 ENCOUNTER — Ambulatory Visit: Payer: Medicare PPO | Admitting: Internal Medicine

## 2020-11-30 ENCOUNTER — Other Ambulatory Visit: Payer: Self-pay

## 2020-11-30 DIAGNOSIS — A318 Other mycobacterial infections: Secondary | ICD-10-CM | POA: Diagnosis not present

## 2020-11-30 NOTE — Assessment & Plan Note (Signed)
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  Although it is possible that surgery alone could cure her infection she has appropriate concerns about the possibility that it could flareup again.  Normally I would use a regimen of azithromycin, ethambutol and rifampin.  However she is on Eliquis and Wellbutrin, both of which have significant, adverse interactions with rifampin.  While the optimal regimen for Mycobacterium szulgai is unknown, it is generally felt that it is best to use a macrolide based regimen and at least 3 active drugs.  We will have her follow-up in 1 week to consider alternative regimens such as azithromycin, ethambutol and ciprofloxacin or azithromycin, ethambutol and clofazimine if we can obtain the latter.

## 2020-11-30 NOTE — Progress Notes (Signed)
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    Priority: High   Tenosynovitis of right hand 09/21/2020    Priority: High   Osteopenia 06/30/2020   Arrhythmia 05/24/2020   Depression    Essential hypertension 08/30/2019   Overweight 08/30/2019   Paroxysmal atrial fibrillation (HCC) 07/22/2019   Hyperlipidemia 07/22/2019   Obesity (BMI 35.0-39.9 without comorbidity) 07/22/2019   Grief at loss of child 07/22/2019   GERD (gastroesophageal reflux disease) 10/13/2015   Recurrent major depressive disorder, in full remission (HCC) 10/13/2015    Patient's Medications  New Prescriptions   No medications on file  Previous Medications   ACETAMINOPHEN (TYLENOL) 500 MG TABLET    Take 500 mg by mouth every 6 (six) hours as needed.   APIXABAN (ELIQUIS) 5 MG TABS TABLET    Take 1 tablet (5 mg total) by mouth 2 (two) times daily.   BUPROPION (WELLBUTRIN) 75 MG TABLET    Take 1 tablet (75 mg total) by mouth daily.   FLUCONAZOLE (DIFLUCAN) 100 MG TABLET    Take 1 tablet (100 mg total) by mouth daily.   METOPROLOL SUCCINATE (TOPROL-XL) 50 MG 24 HR TABLET    Take 1 tablet (50 mg total) by mouth daily.   OMEPRAZOLE (PRILOSEC) 20 MG CAPSULE    Take 20 mg by mouth daily.   OXYCODONE (OXY-IR) 5 MG CAPSULE    Take 5 mg by mouth every 4 (four) hours as needed.   SERTRALINE (ZOLOFT) 50 MG TABLET    TAKE 1 TABLET BY MOUTH EVERY DAY   SIMVASTATIN (ZOCOR) 10 MG TABLET    Take 1 tablet (10 mg total) by mouth daily at 6 PM.  Modified Medications   No medications on file  Discontinued Medications   No medications on file    Subjective: Beth Arroyo is in for her routine follow-up visit.  Earlier this year she developed tenosynovitis of her right hand.  She underwent surgery by Dr. Onalee Hua on 09/19/2020.  He was concerned about the possibility of mycobacterial infection at the time of surgery.  The pathology  report noted noncaseating granulomatous inflammation.  Gram stain and routine cultures were negative.  AFB smear was negative but cultures grew Mycobacterium szulgai around the 6-week mark.  Postoperatively I treated her with amoxicillin clavulanate and she had steady improvement.  She completed 4 weeks of postoperative therapy on 10/20/2020.  She has continued to do well at that time and is making slow progress with physical therapy.  Review of Systems: Review of Systems  Constitutional:  Negative for fever.  Gastrointestinal:  Negative for abdominal pain, diarrhea, nausea and vomiting.  Musculoskeletal:  Negative for joint pain.   Past Medical History:  Diagnosis Date   Arrhythmia 05/24/2020   Depression    Essential hypertension    GERD (gastroesophageal reflux disease) 10/13/2015   Grief at loss of child 07/22/2019   Hyperlipidemia    Obesity (BMI 35.0-39.9 without comorbidity) 07/22/2019   Overweight 08/30/2019   Paroxysmal atrial fibrillation (HCC) 07/22/2019   Recurrent major depressive disorder, in full remission (HCC) 10/13/2015    Social History   Tobacco Use   Smoking status: Former    Types: Cigarettes    Quit date: 08/29/2016    Years since quitting: 4.2   Smokeless tobacco: Never  Substance Use Topics   Alcohol use: Yes    Comment: rare   Drug  use: Never    Family History  Problem Relation Age of Onset   Pulmonary disease Mother    Diabetes Mother    Heart disease Father    Thyroid cancer Sister    Kidney cancer Sister     No Known Allergies  Objective: Vitals:   11/30/20 1111  BP: 123/75  Pulse: (!) 52  Temp: 98.2 F (36.8 C)  TempSrc: Oral  SpO2: 97%  Weight: 187 lb (84.8 kg)   Body mass index is 34.76 kg/m.  Physical Exam Constitutional:      Comments: She is in good spirits.  Cardiovascular:     Rate and Rhythm: Normal rate.  Pulmonary:     Effort: Pulmonary effort is normal.  Musculoskeletal:        General: No swelling or tenderness.      Comments: Her right hand surgical incisions have healed nicely.  I did not note any unusual swelling, redness or warmth.  She has improved range of motion of her right index finger.    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  Although it is possible that surgery alone could cure her infection she has appropriate concerns about the possibility that it could flareup again.  Normally I would use a regimen of azithromycin, ethambutol and rifampin.  However she is on Eliquis and Wellbutrin, both of which have significant, adverse interactions with rifampin.  While the optimal regimen for Mycobacterium szulgai is unknown, it is generally felt that it is best to use a macrolide based regimen and at least 3 active drugs.  We will have her follow-up in 1 week to consider alternative regimens such as azithromycin, ethambutol and ciprofloxacin or azithromycin, ethambutol and clofazimine if we can obtain the latter.         Cliffton Asters, MD [facility] [phone] pager   [phone] cell 11/30/2020, 1:15 PM

## 2020-12-04 ENCOUNTER — Ambulatory Visit: Payer: Medicare PPO | Attending: Orthopedic Surgery | Admitting: Occupational Therapy

## 2020-12-04 ENCOUNTER — Encounter: Payer: Self-pay | Admitting: Occupational Therapy

## 2020-12-04 ENCOUNTER — Other Ambulatory Visit: Payer: Self-pay

## 2020-12-04 DIAGNOSIS — R29898 Other symptoms and signs involving the musculoskeletal system: Secondary | ICD-10-CM | POA: Insufficient documentation

## 2020-12-04 DIAGNOSIS — R600 Localized edema: Secondary | ICD-10-CM | POA: Diagnosis not present

## 2020-12-04 DIAGNOSIS — M6281 Muscle weakness (generalized): Secondary | ICD-10-CM | POA: Insufficient documentation

## 2020-12-04 DIAGNOSIS — M25641 Stiffness of right hand, not elsewhere classified: Secondary | ICD-10-CM | POA: Diagnosis not present

## 2020-12-04 NOTE — Therapy (Signed)
[facility] [address] [place], [place], [address] Phone: [phone]   Fax:  [phone]  Occupational Therapy Treatment  Patient Details  Name: Beth Arroyo MRN: [id number] Date of Birth: April 06, 1956 Referring Provider (OT): Dominica Severin, MD Raechel Chute)   Encounter Date: 12/04/2020   OT End of Session - 12/04/20 1557     Visit Number 6    Number of Visits 7    Date for OT Re-Evaluation 12/31/20    Authorization Type [employer]    Authorization Time Period 6 visits by 12/05/20    Authorization - Visit Number 1    Authorization - Number of Visits 6    OT Start Time 1104    OT Stop Time 1150    OT Time Calculation (min) 46 min    Activity Tolerance Patient tolerated treatment well    Behavior During Therapy [facility] for tasks assessed/performed             Past Medical History:  Diagnosis Date   Arrhythmia 05/24/2020   Depression    Essential hypertension    GERD (gastroesophageal reflux disease) 10/13/2015   Grief at loss of child 07/22/2019   Hyperlipidemia    Obesity (BMI 35.0-39.9 without comorbidity) 07/22/2019   Overweight 08/30/2019   Paroxysmal atrial fibrillation (HCC) 07/22/2019   Recurrent major depressive disorder, in full remission (HCC) 10/13/2015    Past Surgical History:  Procedure Laterality Date   CESAREAN SECTION  1988    There were no vitals filed for this visit.   Subjective Assessment - 12/04/20 1106     Subjective  Pt reports she feels like her finger has improved a little and that both her Ortho MD (Gramig) and Inf Disease MD Orvan Falconer) have cleared her to attempt ultrasound tx in OT    Pertinent History R carpal tunnel release and flexor tendon (FDP and FDS) synovectomy for "extensive tenosynovitis" of R index finger; depression; arrhythmia    Patient Stated Goals Functional use of R hand    Currently in Pain? No/denies   "it just feels different than the other one (L)"             [facility] OT Assessment - 12/04/20 1111       Edema   Edema R index proximal phalanx 6.5 cm (L at 6.1 cm); R wrist at crease 15 cm (L at 14.8 cm)      AROM   Right Wrist Extension 64 Degrees    Right Wrist Flexion 69 Degrees    Left Wrist Extension 66 Degrees    Left Wrist Flexion 70 Degrees      Right Hand AROM   R Index  MCP 0-90 78 Degrees    R Index PIP 0-100 82 Degrees   37 isolated   R Index DIP 0-70 7 Degrees   16 isolated     Hand Function   Right Hand Grip (lbs) 22#             Treatment/Exercises - 12/04/20    Manual Therapy: Soft Tissue Mobilization Soft tissue mobilization massage w/ lotion to R index finger and palm in order to facilitate mobility within tissue structures and decrease tension, edema, and pain. Pt tolerated intervention w/out discomfort or adverse reaction    FDP/FDS Exercises Isolated R index finger PIP and DIP flexion w/ blocking; OT provided modeling, verbal/tactile cues, and education to facilitate success and avoid inadvertent composite finger flexion. Completed 1 set of each w/ OT providing  blocking and 2nd set w/ slef-blocking.            OT Education - 12/04/20 1755     Education Details Reviewed isolated FDP/FDS exercises in depth    Person(s) Educated Patient    Methods Explanation;Demonstration;Verbal cues;Tactile cues    Comprehension Verbalized understanding;Returned demonstration;Verbal cues required             OT Short Term Goals - 12/04/20 1757       OT SHORT TERM GOAL #1   Title Pt will increase AROM of R index MCP, PIP, and DIP flexion by at least 9* to facilitate functional grasp w/ R hand    Baseline R index MCP 47*, PIP 30*, DIP 19*    Time 3    Period Weeks    Status Achieved   11/02/20 - MCP +31 degrees, PIP +52, DIP +14; 10/19/20 - MCP +20, PIP +34, DIP +7   Target Date 10/27/20      OT SHORT TERM GOAL #2   Title Pt will be able to open medium-large containers using both hands (and AE prn) 100% of the time  w/out pain    Baseline Reports difficulty opening containers due to decreased functional use of R hand    Time 3    Period Weeks    Status On-going    Target Date 10/27/20      OT SHORT TERM GOAL #3   Title Pt will decrease R thumb to index finger opposition to at least 1.5 cm to improve functional pinch prehension when manipulating clothing fasteners    Baseline R thumb to index finger opposition at 2.5 cm    Time 3    Period Weeks    Status Achieved   10/19/20 - able to completely oppose thumb to index finger     OT SHORT TERM GOAL #4   Title Pt will improve wrist flexion/extension by at least 10* to improve participation w/ IADLs    Baseline R wrist flexion (59*) and extension (50*); L wrist 74* and 78*    Time 3    Period Weeks    Status Achieved   12/04/20 - R wrist ext 64* and flex 69*     OT SHORT TERM GOAL #5   Title Pt will increase AROM of R index finger PIP flexion by at least 9* to facilitate functional grasp w/ R hand    Baseline 10/19/20 - 64 degrees of PIP flexion    Time 2    Period Weeks    Status Achieved   11/02/20 - 82 degrees of flexion (+18*)   Target Date 11/03/20             OT Long Term Goals - 10/19/20 1114       OT LONG TERM GOAL #1   Title Pt will be independent w/ full HEP designed for ROM and strength of R hand/digits    Baseline No formal HEP at this time    Time 6    Period Weeks    Status On-going      OT LONG TERM GOAL #2   Title Pt will increase AROM of R index finger MCP flexion by at least 14* to improve ability to open medication bottles    Baseline R index MCP 47*    Time 6    Period Weeks    Status Achieved   10/19/20 - MCP flexion at 67 degrees     OT LONG TERM GOAL #3  Title Pt will increase AROM of R index finger PIP flexion by greater than 20* to facilitate functional grasp/hold w/ R hand    Baseline R index PIP 30*    Time 6    Period Weeks    Status Achieved   10/19/20 - PIP flexion at 64 degrees     OT LONG TERM GOAL #4    Title Pt will demonstrate loose gross grasp w/ R hand to improve participation in functional bilateral tasks    Baseline Unable to achieve full grasp w/ R hand at this time    Time 6    Period Weeks    Status On-going      OT LONG TERM GOAL #5   Title Pt will be able to oppose R thumb to index finger within acceptable level of discomfort to improve pinch prehension during functional FM tasks    Baseline R thumb to index finger opposition at 2.5 cm    Time 6    Period Weeks    Status Achieved   10/19/20 - able to completely oppose thumb to index finger            Plan - 12/04/20 1758     Clinical Impression Statement Per pt report, Ortho MD (Gramig) and Inf Disease MD Orvan Falconer([place]) have both cleared her to attempt ultrasound tx in OT to increase mobility and decrease scar tissue adhesions. Due to this, OT first attempted soft tissue mobilization massage to R index finger and palm w/ no adverse reactions. OT also completed re-assessment of ROM, edema, and strenth w/ pt demonstrating improvements in both edema and grip strength.    OT Occupational Profile and History Problem Focused Assessment - Including review of records relating to presenting problem    Occupational performance deficits (Please refer to evaluation for details): ADL's;IADL's;Leisure;Social Participation    Body Structure / Function / Physical Skills ADL;Decreased knowledge of use of DME;Strength;Pain;Dexterity;Body mechanics;Edema;UE functional use;ROM;IADL;Scar mobility;Sensation;Coordination;FMC    Rehab Potential Good    Clinical Decision Making Several treatment options, min-mod task modification necessary    Comorbidities Affecting Occupational Performance: May have comorbidities impacting occupational performance    Modification or Assistance to Complete Evaluation  Min-Moderate modification of tasks or assist with assess necessary to complete eval    OT Frequency 1x / week    OT Duration 6 weeks    OT  Treatment/Interventions Self-care/ADL training;Moist Heat;Fluidtherapy;DME and/or AE instruction;Splinting;Therapeutic activities;Ultrasound;Therapeutic exercise;Scar mobilization;Passive range of motion;Cryotherapy;Electrical Stimulation;Paraffin;Manual Therapy;Patient/family education    Plan Continue soft tissue mobilization/introduce ultrasound? Reassess FDP/FDS exercises (review prn); exercise blocking splint?    Consulted and Agree with Plan of Care Patient             Patient will benefit from skilled therapeutic intervention in order to improve the following deficits and impairments:   Body Structure / Function / Physical Skills: ADL, Decreased knowledge of use of DME, Strength, Pain, Dexterity, Body mechanics, Edema, UE functional use, ROM, IADL, Scar mobility, Sensation, Coordination, [facility]   Visit Diagnosis: Stiffness of right hand, not elsewhere classified  Other symptoms and signs involving the musculoskeletal system  Muscle weakness (generalized)  Localized edema    Problem List Patient Active Problem List   Diagnosis Date Noted   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 [place], [place], [address] Phone: [phone]   Fax:  [phone]  Name: Beth Arroyo MRN: [id number] Date of Birth: 1955/05/11

## 2020-12-07 ENCOUNTER — Telehealth (INDEPENDENT_AMBULATORY_CARE_PROVIDER_SITE_OTHER): Payer: Medicare PPO | Admitting: Internal Medicine

## 2020-12-07 ENCOUNTER — Other Ambulatory Visit: Payer: Self-pay

## 2020-12-07 ENCOUNTER — Encounter: Payer: Self-pay | Admitting: Internal Medicine

## 2020-12-07 ENCOUNTER — Other Ambulatory Visit: Payer: Self-pay | Admitting: Family Medicine

## 2020-12-07 DIAGNOSIS — A318 Other mycobacterial infections: Secondary | ICD-10-CM

## 2020-12-07 MED ORDER — AZITHROMYCIN 250 MG PO TABS: 250.0000 mg | ORAL_TABLET | Freq: Every day | ORAL | 5 refills | Status: DC

## 2020-12-07 MED ORDER — ETHAMBUTOL HCL 400 MG PO TABS
1200.0000 mg | ORAL_TABLET | Freq: Every day | ORAL | 5 refills | Status: DC
Start: 2020-12-07 — End: 2021-02-15

## 2020-12-07 MED ORDER — LEVOFLOXACIN 500 MG PO TABS: 500.0000 mg | ORAL_TABLET | Freq: Every day | ORAL | 5 refills | Status: DC

## 2020-12-07 NOTE — Progress Notes (Signed)
Virtual Visit via Telephone Note  I connected with Beth Arroyo on 12/07/20 at 10:00 AM EDT by telephone and verified that I am speaking with the correct person using two identifiers.  Location: Patient: Home Provider: [facility]   I discussed the limitations, risks, security and privacy concerns of performing an evaluation and management service by telephone and the availability of in person appointments. I also discussed with the patient that there may be a patient responsible charge related to this service. The patient expressed understanding and agreed to proceed.   History of Present Illness: I called and spoke with Beth Arroyo today.  She continues to make slow progress with her physical therapy.   Observations/Objective: Right hand operative cultures grew Mycobacterium szulgai  Assessment and Plan: I recommend that she start daily azithromycin, ethambutol and levofloxacin and attempt to make sure that Mycobacterium szulgai does not flareup.  She is in agreement with that plan.  I have talked to her about potential adverse side effects of her medications.  Follow Up Instructions: Azithromycin 250 mg daily Ethambutol 1200 mg daily Levofloxacin 500 mg daily Follow-up in 4 weeks   I discussed the assessment and treatment plan with the patient. The patient was provided an opportunity to ask questions and all were answered. The patient agreed with the plan and demonstrated an understanding of the instructions.   The patient was advised to call back or seek an in-person evaluation if the symptoms worsen or if the condition fails to improve as anticipated.  I provided 17 minutes of non-face-to-face time during this encounter.   Cliffton Asters, MD

## 2020-12-18 ENCOUNTER — Encounter: Payer: Self-pay | Admitting: Occupational Therapy

## 2020-12-18 ENCOUNTER — Ambulatory Visit: Payer: Medicare PPO | Admitting: Occupational Therapy

## 2020-12-18 ENCOUNTER — Other Ambulatory Visit: Payer: Self-pay

## 2020-12-18 DIAGNOSIS — R29898 Other symptoms and signs involving the musculoskeletal system: Secondary | ICD-10-CM

## 2020-12-18 DIAGNOSIS — R600 Localized edema: Secondary | ICD-10-CM | POA: Diagnosis not present

## 2020-12-18 DIAGNOSIS — M6281 Muscle weakness (generalized): Secondary | ICD-10-CM | POA: Diagnosis not present

## 2020-12-18 DIAGNOSIS — M25641 Stiffness of right hand, not elsewhere classified: Secondary | ICD-10-CM

## 2020-12-18 NOTE — Therapy (Addendum)
[facility] [address] [place], [place], [address] Phone: [phone]   Fax:  [phone]  Occupational Therapy Treatment  Patient Details  Name: Beth AmassSandra Arroyo MRN: [id number] Date of Birth: 16-Mar-1956 Referring Provider (OT): Dominica SeverinWilliam Gramig, MD Raechel Chute([facility])   Encounter Date: 12/18/2020   OT End of Session - 12/18/20 1027     Visit Number 7    Number of Visits 13   Initial 7 visits + 6 add'l   Date for OT Re-Evaluation 12/31/20    Authorization Type [employer]    Authorization Time Period 6 visits by 02/09/21    Authorization - Visit Number 1    Authorization - Number of Visits 6    Progress Note Due on Visit 10    OT Start Time 1017    OT Stop Time 1057    OT Time Calculation (min) 40 min    Activity Tolerance Patient tolerated treatment well    Behavior During Therapy [facility] for tasks assessed/performed            Past Medical History:  Diagnosis Date   Arrhythmia 05/24/2020   Depression    Essential hypertension    GERD (gastroesophageal reflux disease) 10/13/2015   Grief at loss of child 07/22/2019   Hyperlipidemia    Obesity (BMI 35.0-39.9 without comorbidity) 07/22/2019   Overweight 08/30/2019   Paroxysmal atrial fibrillation (HCC) 07/22/2019   Recurrent major depressive disorder, in full remission (HCC) 10/13/2015    Past Surgical History:  Procedure Laterality Date   CESAREAN SECTION  1988    There were no vitals filed for this visit.   Subjective Assessment - 12/18/20 1019     Subjective  Pt reports she has started taking a course of 3 antibiotics to prevent an add'l infectious flare-up and is expected to remain on this course of antibiotics for the next 6-12 months    Pertinent History R carpal tunnel release and flexor tendon (FDP and FDS) synovectomy for "extensive tenosynovitis" of R index finger; depression; arrhythmia    Patient Stated Goals Functional use of R hand    Currently in Pain?  No/denies    Pain Score 0-No pain    Pain Location Finger (Comment which one)   Index finger   Pain Orientation Right    Pain Descriptors / Indicators Sore   "Just from moving it"            Treatment/Exercises - 12/18/20      Manual Therapy   Soft Tissue Mobilzation Soft tissue mobilization massage w/ lotion to R index finger, palm, and wrist in order to facilitate mobility within tissue structures and decrease tension, edema, and pain; pt tolerated intervention w/out discomfort or adverse reaction    PROM OT-facilitated PROM of R MCP, PIP, and DIP flex/ext, holding stretch at end range, to improve ROM and decrease stiffness; no adverse reactions w/ pt returning demo of MCP flex/ext to include in HEP         [facility] Assessment - 12/18/20 1030       Edema   Edema R index proximal phalanx 6.2 cm (L at 6.1 cm); R wrist at crease 14.9 cm (L at 14.8 cm)      Right Hand AROM   R Index  MCP 0-90 81 Degrees    R Index PIP 0-100 80 Degrees    R Index DIP 0-70 25 Degrees      Hand Function   Right Hand Grip (lbs) 22#  Right Hand 3 Point Pinch 8 lbs   5# pincer grip            OT Short Term Goals - 12/18/20 1248       OT SHORT TERM GOAL #1   Title Pt will increase AROM of R index MCP, PIP, and DIP flexion by at least 9* to facilitate functional grasp w/ R hand    Baseline R index MCP 47*, PIP 30*, DIP 19*    Time 3    Period Weeks    Status Achieved   11/02/20 - MCP +31 degrees, PIP +52, DIP +14; 10/19/20 - MCP +20, PIP +34, DIP +7   Target Date 10/27/20      OT SHORT TERM GOAL #2   Title Pt will be able to open medium-large containers using both hands (and AE prn) 100% of the time w/out pain    Baseline Reports difficulty opening containers due to decreased functional use of R hand    Time 3    Period Weeks    Status Achieved   12/18/20 - pt reports she is able to open various jars/bottles/containers w/out difficulty at home due to improved R hand ROM/grip strength    Target Date 10/27/20      OT SHORT TERM GOAL #3   Title Pt will decrease R thumb to index finger opposition to at least 1.5 cm to improve functional pinch prehension when manipulating clothing fasteners    Baseline R thumb to index finger opposition at 2.5 cm    Time 3    Period Weeks    Status Achieved   10/19/20 - able to completely oppose thumb to index finger     OT SHORT TERM GOAL #4   Title Pt will improve wrist flexion/extension by at least 10* to improve participation w/ IADLs    Baseline R wrist flexion (59*) and extension (50*); L wrist 74* and 78*    Time 3    Period Weeks    Status Achieved   12/04/20 - R wrist ext 64* and flex 69*     OT SHORT TERM GOAL #5   Title Pt will increase AROM of R index finger PIP flexion by at least 9* to facilitate functional grasp w/ R hand    Baseline 10/19/20 - 64 degrees of PIP flexion    Time 2    Period Weeks    Status Achieved   11/02/20 - 82 degrees of flexion (+18*)   Target Date 11/03/20             OT Long Term Goals - 12/18/20 1249       OT LONG TERM GOAL #1   Title Pt will demo independence w/ full HEP designed for ROM and strength of R hand/digits    Baseline No formal HEP at this time    Time 6    Period Weeks    Status On-going      OT LONG TERM GOAL #2   Title Pt will increase AROM of R index finger MCP flexion by at least 14* to improve ability to open medication bottles    Baseline R index MCP 47*    Time 6    Period Weeks    Status Achieved   10/19/20 - MCP flexion at 67 degrees     OT LONG TERM GOAL #3   Title Pt will increase AROM of R index finger PIP flexion by greater than 20* to facilitate functional grasp/hold w/ R  hand    Baseline R index PIP 30*    Time 6    Period Weeks    Status Achieved   10/19/20 - PIP flexion at 64 degrees     OT LONG TERM GOAL #4   Title Pt will demonstrate loose gross grasp w/ R hand to improve participation in functional bilateral tasks    Baseline Unable to achieve full  grasp w/ R hand at this time    Time 6    Period Weeks    Status Achieved   12/18/20 - demo's loose R hand grasp w/ decreased index finger involvement w/out assist     OT LONG TERM GOAL #5   Title Pt will be able to oppose R thumb to index finger within acceptable level of discomfort to improve pinch prehension during functional FM tasks    Baseline R thumb to index finger opposition at 2.5 cm    Time 6    Period Weeks    Status Achieved   10/19/20 - able to completely oppose thumb to index finger     OT LONG TERM GOAL #6   Title Pt will improve R index finger PIP/DIP flexion by at least 5 degrees to improve functional grip during bilateral tasks    Baseline 12/18/20 - PIP flexion 80 degrees; DIP 25 degrees    Time 4    Period Weeks    Status New    Target Date 01/19/21      OT LONG TERM GOAL #7   Title Pt will improve grip strength by at least 4# for improved participation in IADLs    Baseline 12/18/20 - 22# w/ R hand    Time 6    Period Weeks    Status New    Target Date 02/02/21             Plan - 12/18/20 1250     Clinical Impression Statement Due to pt re-starting a course of multiple antibiotics to prevent an add'l infectious flare-up as well as pt concerns, OT discussed POC focusing on manual soft tissue mobilization and PROM to increase mobility/decrease scar tissue adhesions out of an abundance of caution w/ pt agreeable at this time.    OT Occupational Profile and History Problem Focused Assessment - Including review of records relating to presenting problem    Occupational performance deficits (Please refer to evaluation for details): ADL's;IADL's;Leisure;Social Participation    Body Structure / Function / Physical Skills ADL;Decreased knowledge of use of DME;Strength;Pain;Dexterity;Body mechanics;Edema;UE functional use;ROM;IADL;Scar mobility;Sensation;Coordination;FMC    Rehab Potential Good    Clinical Decision Making Several treatment options, min-mod task  modification necessary    Comorbidities Affecting Occupational Performance: May have comorbidities impacting occupational performance    Modification or Assistance to Complete Evaluation  Min-Moderate modification of tasks or assist with assess necessary to complete eval    OT Frequency 1x / week    OT Duration 6 weeks    OT Treatment/Interventions Self-care/ADL training;Moist Heat;Fluidtherapy;DME and/or AE instruction;Splinting;Therapeutic activities;Ultrasound;Therapeutic exercise;Scar mobilization;Passive range of motion;Cryotherapy;Electrical Stimulation;Paraffin;Manual Therapy;Patient/family education    Plan Continue manual soft tissue mobilization/PROM; MCP and/or PIP blocking splint for exercises?    Consulted and Agree with Plan of Care Patient             Patient will benefit from skilled therapeutic intervention in order to improve the following deficits and impairments:   Body Structure / Function / Physical Skills: ADL, Decreased knowledge of use of DME, Strength, Pain, Dexterity, Body mechanics,  Edema, UE functional use, ROM, IADL, Scar mobility, Sensation, Coordination, [facility]   Visit Diagnosis: Stiffness of right hand, not elsewhere classified  Other symptoms and signs involving the musculoskeletal system  Muscle weakness (generalized)  Localized edema    Problem List Patient Active Problem List   Diagnosis Date Noted   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   Tenosynovitis of right hand 09/21/2020   Osteopenia 06/30/2020   Arrhythmia 05/24/2020   Depression    Essential hypertension 08/30/2019   Overweight 08/30/2019   Paroxysmal atrial fibrillation (HCC) 07/22/2019   Hyperlipidemia 07/22/2019   Obesity (BMI 35.0-39.9 without comorbidity) 07/22/2019   Grief at loss of child 07/22/2019   GERD (gastroesophageal reflux disease) 10/13/2015   Recurrent major depressive disorder, in full remission (HCC) 10/13/2015     Rosie Fate, OTR/L, MSOT 12/18/2020,  1:00 PM  [facility] [address] [place], [place], [address] Phone: [phone]   Fax:  [phone]  Name: Beth Arroyo MRN: [id number] Date of Birth: 10/13/1955

## 2020-12-27 ENCOUNTER — Ambulatory Visit: Payer: Medicare PPO | Admitting: Occupational Therapy

## 2021-01-05 ENCOUNTER — Ambulatory Visit: Payer: Medicare PPO | Admitting: Occupational Therapy

## 2021-01-09 ENCOUNTER — Ambulatory Visit: Payer: Medicare PPO | Admitting: Internal Medicine

## 2021-01-10 ENCOUNTER — Ambulatory Visit (INDEPENDENT_AMBULATORY_CARE_PROVIDER_SITE_OTHER): Payer: Medicare PPO | Admitting: Internal Medicine

## 2021-01-10 ENCOUNTER — Other Ambulatory Visit: Payer: Self-pay

## 2021-01-10 DIAGNOSIS — A318 Other mycobacterial infections: Secondary | ICD-10-CM

## 2021-01-10 NOTE — Progress Notes (Signed)
Virtual Visit via Telephone Note  I connected with Denyse Amass on 01/10/21 at  9:45 AM EDT by telephone and verified that I am speaking with the correct person using two identifiers.  Location: Patient: Home Provider: [facility]   I discussed the limitations, risks, security and privacy concerns of performing an evaluation and management service by telephone and the availability of in person appointments. I also discussed with the patient that there may be a patient responsible charge related to this service. The patient expressed understanding and agreed to proceed.   History of Present Illness: I called and spoke with [place] today.  She has now been on azithromycin, ethambutol and levofloxacin for 1 month as treatment for right hand tenosynovitis.  She received a long course of oral amoxicillin clavulanate following surgery and had a good response but operative cultures grew Mycobacterium szulgai prompting the change in antibiotic therapy.  She says that she had 1 episode of diarrhea in the past month but otherwise has tolerated her antibiotics very well.  She has not had any fever, abdominal pain, nausea, vomiting or change in her vision.  She continues to do physical therapy.  Her therapist measures her hand and index finger flexibility at each visit.  She says that there has been very slight improvement over the past month.   Observations/Objective:   Assessment and Plan: She is tolerating her antimycobacterial antibiotic regimen.  This is such an unusual situation there is no evidence-based guideline for optimal duration of treatment.  I recommend that we treat for at least 1 more month.  She is in agreement with that plan.  Follow Up Instructions: Will continue current antibiotics and follow-up here in 1 month   I discussed the assessment and treatment plan with the patient. The patient was provided an opportunity to ask questions and all were answered. The patient agreed with the plan  and demonstrated an understanding of the instructions.   The patient was advised to call back or seek an in-person evaluation if the symptoms worsen or if the condition fails to improve as anticipated.  I provided 16 minutes of non-face-to-face time during this encounter.   Cliffton Asters, MD

## 2021-01-12 ENCOUNTER — Ambulatory Visit: Payer: Medicare PPO | Attending: Orthopedic Surgery | Admitting: Occupational Therapy

## 2021-01-12 ENCOUNTER — Other Ambulatory Visit: Payer: Self-pay

## 2021-01-12 DIAGNOSIS — M6281 Muscle weakness (generalized): Secondary | ICD-10-CM | POA: Insufficient documentation

## 2021-01-12 DIAGNOSIS — R29898 Other symptoms and signs involving the musculoskeletal system: Secondary | ICD-10-CM | POA: Insufficient documentation

## 2021-01-12 DIAGNOSIS — M25641 Stiffness of right hand, not elsewhere classified: Secondary | ICD-10-CM | POA: Insufficient documentation

## 2021-01-13 NOTE — Therapy (Signed)
[facility] [address] [place], [place], [address] Phone: [phone]   Fax:  [phone]  Occupational Therapy Treatment  Patient Details  Name: Beth Arroyo MRN: [id number] Date of Birth: [birth year] Referring Provider (OT): Dominica Severin, MD Raechel Chute)   Encounter Date: 01/12/2021   OT End of Session - 01/12/21 1023     Visit Number 8    Number of Visits 13   Initial 7 visits + 6 add'l   Date for OT Re-Evaluation 02/11/21    Authorization Type [employer]    Authorization Time Period 6 visits by 02/09/21    Authorization - Visit Number 2    Authorization - Number of Visits 6    Progress Note Due on Visit 10    OT Start Time 1017    OT Stop Time 1100    OT Time Calculation (min) 43 min    Activity Tolerance Patient tolerated treatment well    Behavior During Therapy [facility] for tasks assessed/performed            Past Medical History:  Diagnosis Date   Arrhythmia 05/24/2020   Depression    Essential hypertension    GERD (gastroesophageal reflux disease) 10/13/2015   Grief at loss of child 07/22/2019   Hyperlipidemia    Obesity (BMI 35.0-39.9 without comorbidity) 07/22/2019   Overweight 08/30/2019   Paroxysmal atrial fibrillation (HCC) 07/22/2019   Recurrent major depressive disorder, in full remission (HCC) 10/13/2015    Past Surgical History:  Procedure Laterality Date   CESAREAN SECTION  1988    There were no vitals filed for this visit.   Subjective Assessment - 01/12/21 1019     Subjective  Pt reports she has noticed small improvements, but generally feels like ROM and strength remain about the same as her prior visit. Pt also states she has noticed a "knot" around the center of her lower palm and will discuss this with her surgeon.    Pertinent History R carpal tunnel release and flexor tendon (FDP and FDS) synovectomy for "extensive tenosynovitis" of R index finger; depression; arrhythmia     Patient Stated Goals Functional use of R hand    Currently in Pain? No/denies             Treatment/Exercises - 01/12/21    Soft Tissue Mobilization Soft tissue mobilization massage w/ lotion to R index finger, palm, and wrist in order to facilitate mobility within tissue structures and decrease tension, edema, and pain; pt tolerated intervention w/out discomfort or adverse reaction. Notable improvement in rigidity of tissue particularly along index finger    Person(s) Educated Thumb-to-index finger opposition completed 5x along length of putty w/ emphasis on tip-to-tip strengthening vs. pad-to-pad prehension due to improvements in AROM of DIP flexion    Stretching Contract-relax stretching to R index finger PIP and DIP to use isotonic contraction in order to facilitate increased stretch and mobility; pt demonstrated good strength and was able to achieve full extension each rep         OT Education - 01/12/21 1115     Education Details Reviewed full HEP (AROM, PROM, light strengthening w/ putty)    Person(s) Educated Patient    Methods Explanation;Demonstration;Verbal cues    Comprehension Verbalized understanding;Returned demonstration             OT Short Term Goals - 12/18/20 1248       OT SHORT TERM GOAL #1   Title Pt will increase AROM of  R index MCP, PIP, and DIP flexion by at least 9* to facilitate functional grasp w/ R hand    Baseline R index MCP 47*, PIP 30*, DIP 19*    Time 3    Period Weeks    Status Achieved   11/02/20 - MCP +31 degrees, PIP +52, DIP +14; 10/19/20 - MCP +20, PIP +34, DIP +7   Target Date 10/27/20      OT SHORT TERM GOAL #2   Title Pt will be able to open medium-large containers using both hands (and AE prn) 100% of the time w/out pain    Baseline Reports difficulty opening containers due to decreased functional use of R hand    Time 3    Period Weeks    Status Achieved   12/18/20 - pt reports she is able to open various jars/bottles/containers  w/out difficulty at home due to improved R hand ROM/grip strength   Target Date 10/27/20      OT SHORT TERM GOAL #3   Title Pt will decrease R thumb to index finger opposition to at least 1.5 cm to improve functional pinch prehension when manipulating clothing fasteners    Baseline R thumb to index finger opposition at 2.5 cm    Time 3    Period Weeks    Status Achieved   10/19/20 - able to completely oppose thumb to index finger     OT SHORT TERM GOAL #4   Title Pt will improve wrist flexion/extension by at least 10* to improve participation w/ IADLs    Baseline R wrist flexion (59*) and extension (50*); L wrist 74* and 78*    Time 3    Period Weeks    Status Achieved   12/04/20 - R wrist ext 64* and flex 69*     OT SHORT TERM GOAL #5   Title Pt will increase AROM of R index finger PIP flexion by at least 9* to facilitate functional grasp w/ R hand    Baseline 10/19/20 - 64 degrees of PIP flexion    Time 2    Period Weeks    Status Achieved   11/02/20 - 82 degrees of flexion (+18*)   Target Date 11/03/20             OT Long Term Goals - 01/12/21 1036       OT LONG TERM GOAL #1   Title Pt will demo independence w/ full HEP designed for ROM and strength of R hand/digits    Baseline No formal HEP at this time    Time 6    Period Weeks    Status Achieved      OT LONG TERM GOAL #2   Title Pt will increase AROM of R index finger MCP flexion by at least 14* to improve ability to open medication bottles    Baseline R index MCP 47*    Time 6    Period Weeks    Status Achieved   10/19/20 - MCP flexion at 67 degrees     OT LONG TERM GOAL #3   Title Pt will increase AROM of R index finger PIP flexion by greater than 20* to facilitate functional grasp/hold w/ R hand    Baseline R index PIP 30*    Time 6    Period Weeks    Status Achieved   10/19/20 - PIP flexion at 64 degrees     OT LONG TERM GOAL #4   Title Pt will demonstrate loose  gross grasp w/ R hand to improve participation  in functional bilateral tasks    Baseline Unable to achieve full grasp w/ R hand at this time    Time 6    Period Weeks    Status Achieved   12/18/20 - demo's loose R hand grasp w/ decreased index finger involvement w/out assist     OT LONG TERM GOAL #5   Title Pt will be able to oppose R thumb to index finger within acceptable level of discomfort to improve pinch prehension during functional FM tasks    Baseline R thumb to index finger opposition at 2.5 cm    Time 6    Period Weeks    Status Achieved   10/19/20 - able to completely oppose thumb to index finger     OT LONG TERM GOAL #6   Title Pt will improve R index finger PIP/DIP flexion by at least 5 degrees to improve functional grip during bilateral tasks    Baseline 12/18/20 - PIP flexion 37 degrees (isolated w/ blocking); DIP 25 degrees (isolated w/ blocking)    Time 4    Period Weeks    Status Achieved   01/12/21 - PIP flexion isolated w/ blocking 44* and DIP at 30*     OT LONG TERM GOAL #7   Title Pt will improve grip strength by at least 4# for improved participation in IADLs    Baseline 12/18/20 - 22# w/ R hand    Time 6    Period Weeks    Status Achieved   01/12/21 - 28#            Plan - 01/12/21 1043     Clinical Impression Statement Ms. Klumb has been seen in OT to address decreased ROM and strength s/p R carpal tunnel release and flexor tendon (FDP and FDS) synovectomy. Pt is currently almost 4 months post-op and has shown improvement in R wrist/hand edema, independence w/ HEP, and AROM of R index finger MCP flex/ext; while pt has demonstrated some improvement in PIP and DIP ROM/strength, she does continue to experience limitations at these joints. Considering progress, time since surgery, and report from pt that she is pleased with her current functional level, OT discussed potential d/c w/ pt who was agreeable; OT to receive further recommendation/confirmation from surgeon before proceeding.    OT Occupational  Profile and History Problem Focused Assessment - Including review of records relating to presenting problem    Occupational performance deficits (Please refer to evaluation for details): ADL's;IADL's;Leisure;Social Participation    Body Structure / Function / Physical Skills ADL;Decreased knowledge of use of DME;Strength;Pain;Dexterity;Body mechanics;Edema;UE functional use;ROM;IADL;Scar mobility;Sensation;Coordination;FMC    Rehab Potential Good    Clinical Decision Making Several treatment options, min-mod task modification necessary    Comorbidities Affecting Occupational Performance: May have comorbidities impacting occupational performance    Modification or Assistance to Complete Evaluation  Min-Moderate modification of tasks or assist with assess necessary to complete eval    OT Frequency 1x / week    OT Duration 6 weeks    OT Treatment/Interventions Self-care/ADL training;Moist Heat;Fluidtherapy;DME and/or AE instruction;Splinting;Therapeutic activities;Ultrasound;Therapeutic exercise;Scar mobilization;Passive range of motion;Cryotherapy;Electrical Stimulation;Paraffin;Manual Therapy;Patient/family education    Plan Potential d/c    Consulted and Agree with Plan of Care Patient            Patient will benefit from skilled therapeutic intervention in order to improve the following deficits and impairments:   Body Structure / Function / Physical Skills: ADL, Decreased knowledge  of use of DME, Strength, Pain, Dexterity, Body mechanics, Edema, UE functional use, ROM, IADL, Scar mobility, Sensation, Coordination, [facility]   Visit Diagnosis: Stiffness of right hand, not elsewhere classified  Other symptoms and signs involving the musculoskeletal system  Muscle weakness (generalized)   Problem List Patient Active Problem List   Diagnosis Date Noted   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   Tenosynovitis of right hand 09/21/2020   Osteopenia 06/30/2020   Arrhythmia 05/24/2020    Depression    Essential hypertension 08/30/2019   Overweight 08/30/2019   Paroxysmal atrial fibrillation (HCC) 07/22/2019   Hyperlipidemia 07/22/2019   Obesity (BMI 35.0-39.9 without comorbidity) 07/22/2019   Grief at loss of child 07/22/2019   GERD (gastroesophageal reflux disease) 10/13/2015   Recurrent major depressive disorder, in full remission (HCC) 10/13/2015    Rosie Fate, OTR/L, MSOT 01/13/2021, 5:16 PM  [facility] [address] [place], [place], [address] Phone: [phone]   Fax:  [phone]  Name: Beth Arroyo MRN: [id number] Date of Birth: 05/20/1955

## 2021-01-31 DIAGNOSIS — M65841 Other synovitis and tenosynovitis, right hand: Secondary | ICD-10-CM | POA: Diagnosis not present

## 2021-01-31 DIAGNOSIS — Z4789 Encounter for other orthopedic aftercare: Secondary | ICD-10-CM | POA: Diagnosis not present

## 2021-01-31 DIAGNOSIS — M79641 Pain in right hand: Secondary | ICD-10-CM | POA: Diagnosis not present

## 2021-02-08 ENCOUNTER — Ambulatory Visit: Payer: Medicare PPO | Admitting: Internal Medicine

## 2021-02-15 ENCOUNTER — Ambulatory Visit: Payer: Medicare PPO | Admitting: Internal Medicine

## 2021-02-15 ENCOUNTER — Other Ambulatory Visit: Payer: Self-pay

## 2021-02-15 DIAGNOSIS — A318 Other mycobacterial infections: Secondary | ICD-10-CM | POA: Diagnosis not present

## 2021-02-15 NOTE — Assessment & Plan Note (Signed)
She has now had 2-1/2 months of therapy for possible Mycobacterium szulgai infection.  She has had no evidence of active infection since her surgery.  There are no published guidelines to direct optimal duration of therapy in this unusual case.  I am very comfortable having her stop antibiotic therapy now.  She is in agreement with that plan.  She will follow-up in 1 month.

## 2021-02-15 NOTE — Progress Notes (Signed)
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   Tenosynovitis of right hand 09/21/2020    Priority: 1.   Osteopenia 06/30/2020   Arrhythmia 05/24/2020   Depression    Essential hypertension 08/30/2019   Overweight 08/30/2019   Paroxysmal atrial fibrillation (HCC) 07/22/2019   Hyperlipidemia 07/22/2019   Obesity (BMI 35.0-39.9 without comorbidity) 07/22/2019   Grief at loss of child 07/22/2019   GERD (gastroesophageal reflux disease) 10/13/2015   Recurrent major depressive disorder, in full remission (HCC) 10/13/2015    Patient's Medications  New Prescriptions   No medications on file  Previous Medications   ACETAMINOPHEN (TYLENOL) 500 MG TABLET    Take 500 mg by mouth every 6 (six) hours as needed.   APIXABAN (ELIQUIS) 5 MG TABS TABLET    Take 1 tablet (5 mg total) by mouth 2 (two) times daily.   BUPROPION (WELLBUTRIN) 75 MG TABLET    TAKE 1 TABLET BY MOUTH EVERY DAY   METOPROLOL SUCCINATE (TOPROL-XL) 50 MG 24 HR TABLET    Take 1 tablet (50 mg total) by mouth daily.   OMEPRAZOLE (PRILOSEC) 20 MG CAPSULE    Take 20 mg by mouth daily.   OXYCODONE (OXY-IR) 5 MG CAPSULE    Take 5 mg by mouth every 4 (four) hours as needed.   SERTRALINE (ZOLOFT) 50 MG TABLET    TAKE 1 TABLET BY MOUTH EVERY DAY   SIMVASTATIN (ZOCOR) 10 MG TABLET    TAKE 1 TABLET (10 MG TOTAL) BY MOUTH DAILY AT 6 PM.  Modified Medications   No medications on file  Discontinued Medications   AZITHROMYCIN (ZITHROMAX) 250 MG TABLET    Take 1 tablet (250 mg total) by mouth daily.   ETHAMBUTOL (MYAMBUTOL) 400 MG TABLET    Take 3 tablets (1,200 mg total) by mouth daily.   FLUCONAZOLE (DIFLUCAN) 100 MG TABLET    Take 1 tablet (100 mg total) by mouth daily.   LEVOFLOXACIN (LEVAQUIN) 500 MG TABLET    Take 1 tablet (500 mg total) by mouth daily.    Subjective: Beth Arroyo is in for her routine follow-up visit.   She has now been on azithromycin, ethambutol and levofloxacin since 12/07/2020 as treatment for right hand tenosynovitis.  She received a long course of oral amoxicillin clavulanate following surgery and had a good response but operative cultures grew Mycobacterium szulgai prompting the change in antibiotic therapy.  She says she has soft stool since starting her antibiotics but otherwise has tolerated her antibiotics very well.  She has not had any fever, abdominal pain, nausea, vomiting or change in her vision.  She continues to do physical therapy.    Review of Systems: Review of Systems  Constitutional:  Negative for fever.  Gastrointestinal:  Negative for abdominal pain, diarrhea, nausea and vomiting.  Musculoskeletal:  Negative for joint pain.   Past Medical History:  Diagnosis Date   Arrhythmia 05/24/2020   Depression    Essential hypertension    GERD (gastroesophageal reflux disease) 10/13/2015   Grief at loss of child 07/22/2019   Hyperlipidemia    Obesity (BMI 35.0-39.9 without comorbidity) 07/22/2019   Overweight 08/30/2019   Paroxysmal atrial fibrillation (HCC) 07/22/2019   Recurrent major depressive disorder, in full remission (HCC) 10/13/2015    Social History   Tobacco Use   Smoking status: Former    Types: Cigarettes  Quit date: 08/29/2016    Years since quitting: 4.4   Smokeless tobacco: Never  Substance Use Topics   Alcohol use: Yes    Comment: rare   Drug use: Never    Family History  Problem Relation Age of Onset   Pulmonary disease Mother    Diabetes Mother    Heart disease Father    Thyroid cancer Sister    Kidney cancer Sister     No Known Allergies  Objective: Vitals:   02/15/21 1012  BP: 119/75  Pulse: (!) 47  Temp: (!) 96.8 F (36 C)  TempSrc: Temporal  SpO2: 97%  Weight: 187 lb (84.8 kg)   Body mass index is 34.76 kg/m.  Physical Exam Constitutional:      Comments: She is in good spirits.  Musculoskeletal:        General: No swelling  or tenderness.     Comments: She still has somewhat limited flexibility right index finger but this is much improved over the past several months.  Her surgical incisions have healed nicely.  There is no unusual swelling, redness or warmth.    Lab Results    Problem List Items Addressed This Visit       1.   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  She has had no evidence of active infection since her surgery.  There are no published guidelines to direct optimal duration of therapy in this unusual case.  I am very comfortable having her stop antibiotic therapy now.  She is in agreement with that plan.  She will follow-up in 1 month.        Cliffton Asters, MD [facility] [phone] pager   [phone] cell 02/15/2021, 10:35 AM

## 2021-03-15 ENCOUNTER — Encounter: Payer: Self-pay | Admitting: Internal Medicine

## 2021-03-15 ENCOUNTER — Other Ambulatory Visit: Payer: Self-pay

## 2021-03-15 ENCOUNTER — Ambulatory Visit (INDEPENDENT_AMBULATORY_CARE_PROVIDER_SITE_OTHER): Payer: Medicare PPO | Admitting: Internal Medicine

## 2021-03-15 DIAGNOSIS — A318 Other mycobacterial infections: Secondary | ICD-10-CM | POA: Diagnosis not present

## 2021-03-15 NOTE — Progress Notes (Signed)
Virtual Visit via Telephone Note  I connected with Beth Arroyo on 03/15/21 at 10:00 AM EST by telephone and verified that I am speaking with the correct person using two identifiers.  Location: Patient: Home Provider: [facility]   I discussed the limitations, risks, security and privacy concerns of performing an evaluation and management service by telephone and the availability of in person appointments. I also discussed with the patient that there may be a patient responsible charge related to this service. The patient expressed understanding and agreed to proceed.   History of Present Illness: I called Beth Arroyo today.  She completed 2-1/2 months of azithromycin, ethambutol and levofloxacin on 02/15/2021 for her Mycobacterium szulgae tenosynovitis of her right wrist and hand.  She is feeling a little better off of antibiotics.  Her more frequent soft stools have now reverted back to her usual pattern.  She has had no new pain, redness or swelling of her right wrist or hand.  She continues to make slow progress with increasing flexibility of her fingers with physical therapy.   Observations/Objective:   Assessment and Plan: I believe that her infection has been cured through a combination of antibiotics, surgery and time.  Follow Up Instructions: She will follow-up here as needed   I discussed the assessment and treatment plan with the patient. The patient was provided an opportunity to ask questions and all were answered. The patient agreed with the plan and demonstrated an understanding of the instructions.   The patient was advised to call back or seek an in-person evaluation if the symptoms worsen or if the condition fails to improve as anticipated.  I provided 14 minutes of non-face-to-face time during this encounter.   Cliffton Asters, MD

## 2021-03-21 ENCOUNTER — Telehealth: Payer: Self-pay | Admitting: Family Medicine

## 2021-03-21 NOTE — Telephone Encounter (Signed)
Left message for patient to call back and schedule Medicare Annual Wellness Visit (AWV) in office.  ° °If not able to come in office, please offer to do virtually or by telephone.  Left office number and my jabber #[phone]. ° °Due for AWVI ° °Please schedule at anytime with Nurse Health Advisor. °  °

## 2021-04-24 DIAGNOSIS — Z20822 Contact with and (suspected) exposure to covid-19: Secondary | ICD-10-CM | POA: Diagnosis not present

## 2021-04-29 ENCOUNTER — Other Ambulatory Visit: Payer: Self-pay | Admitting: Family Medicine

## 2021-05-14 NOTE — Patient Instructions (Addendum)
It was good to see you again today!  I will be in touch with your labs You finished out your pneumonia vaccine series today Also consider getting the shingles vaccine series (shingrix) at your pharmacy at your convenience I ordered your mammogram which can be done at the imaging dept on the ground floor

## 2021-05-14 NOTE — Progress Notes (Addendum)
[facility] [place], [place], [place] [address] [phone]  Date:  05/21/2021   Name:  Beth Arroyo   DOB:  [birth year]   MRN:  [id number]  PCP:  Darreld Mclean, MD    Chief Complaint: Follow-up (Concerns/ questions: none/Flu shot received 02/19/21/)   History of Present Illness:  Beth Arroyo is a 66 y.o. very pleasant female patient who presents with the following:   Patient seen today for follow-up History of paroxysmal atrial fib, depression/grief, hypertension, osteopenia, hyperlipidemia Most recent visit with myself about 1 year ago She is a retired Pharmacist, hospital, formally taught second grade at [employer] She retired in 2014- she keeps busy walking her dog, spending time with friends, gardening, reading No living children; her son Thedore Mins died in 3 at age 13.    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   Tenosynovitis of right hand 09/21/2020   Osteopenia 06/30/2020   Arrhythmia 05/24/2020   Depression    Essential hypertension 08/30/2019   Overweight 08/30/2019   Paroxysmal atrial fibrillation ([place]) 07/22/2019   Hyperlipidemia 07/22/2019   Obesity (BMI 35.0-39.9 without comorbidity) 07/22/2019   Grief at loss of child 07/22/2019   GERD (gastroesophageal reflux disease) 10/13/2015   Recurrent major depressive disorder, in full remission ([place]) 10/13/2015    Past Medical History:  Diagnosis Date   Arrhythmia 05/24/2020   Depression    Essential hypertension    GERD (gastroesophageal reflux disease) 10/13/2015   Grief at loss of child 07/22/2019   Hyperlipidemia    Obesity (BMI 35.0-39.9 without comorbidity) 07/22/2019   Overweight 08/30/2019   Paroxysmal atrial fibrillation ([place]) 07/22/2019   Recurrent major depressive disorder, in full remission ([place]) 10/13/2015    Past Surgical History:  Procedure Laterality Date   CESAREAN SECTION  1988    Social History   Tobacco Use   Smoking status: Former    Types: Cigarettes    Quit date: 08/29/2016    Years since quitting: 4.7   Smokeless tobacco: Never  Substance Use Topics   Alcohol use: Yes    Comment: rare   Drug use: Never    Family History  Problem Relation Age of Onset   Pulmonary disease Mother    Diabetes Mother    Heart  disease Father    Thyroid cancer Sister    Kidney cancer Sister     No Known Allergies  Medication list has been reviewed and updated.  Current Outpatient Medications on File Prior to Visit  Medication Sig Dispense Refill   buPROPion (WELLBUTRIN) 75 MG tablet TAKE 1 TABLET BY MOUTH EVERY DAY 90 tablet 3   ELIQUIS 5 MG TABS tablet TAKE 1 TABLET BY MOUTH TWICE A DAY 180 tablet 3   metoprolol succinate (TOPROL-XL) 50 MG 24 hr tablet TAKE 1 TABLET BY MOUTH EVERY DAY 90 tablet 1   omeprazole (PRILOSEC) 20 MG capsule  Take 20 mg by mouth daily.     sertraline (ZOLOFT) 50 MG tablet TAKE 1 TABLET BY MOUTH EVERY DAY 90 tablet 1   simvastatin (ZOCOR) 10 MG tablet TAKE 1 TABLET (10 MG TOTAL) BY MOUTH DAILY AT 6 PM. 90 tablet 0   No current facility-administered medications on file prior to visit.    Review of Systems:  As per HPI- otherwise negative.   Physical Examination: Vitals:   05/21/21 1013  BP: 112/80  Pulse: (!) 52  Resp: 18  Temp: 98.2 F (36.8 C)  SpO2: 98%   Vitals:   05/21/21 1013  Weight: 191 lb 3.2 oz (86.7 kg)  Height: 5\' 2"  (1.575 m)   Body mass index is 34.97 kg/m. Ideal Body Weight: Weight in (lb) to have BMI = 25: 136.4  GEN: no acute distress.  Morbid obesity, looks well HEENT: Atraumatic, Normocephalic.  Bilateral TM wnl, oropharynx normal.  PEERL,EOMI.   Ears and Nose: No external deformity. CV: RRR, No M/G/R. No JVD. No thrill. No extra heart sounds. PULM: CTA B, no wheezes, crackles, rhonchi. No retractions. No resp. distress. No accessory muscle use. ABD: S, NT, ND, +BS. No rebound. No HSM. EXTR: No c/c/e PSYCH: Normally interactive. Conversant.    Assessment and Plan: Atrial fibrillation, unspecified type (HCC) - Plan: CBC, Comprehensive metabolic panel, TSH  Mixed hyperlipidemia - Plan: Lipid panel  Estrogen deficiency  Encounter for hepatitis C screening test for low risk patient - Plan: Hepatitis C antibody  Screening for diabetes mellitus - Plan: Comprehensive metabolic panel, Hemoglobin A1c  Fatigue, unspecified type - Plan: TSH, VITAMIN D 25 Hydroxy (Vit-D Deficiency, Fractures)  Screening mammogram for breast cancer - Plan: MM 3D SCREEN BREAST BILATERAL  Recurrent major depressive disorder, in full remission (HCC) - Plan: sertraline (ZOLOFT) 50 MG tablet  Immunization due - Plan: Pneumococcal conjugate vaccine 20-valent (Prevnar 20)  Patient seen today for follow-up Ordered mammogram Update pneumonia vaccine She notes good control of her  depression, continue sertraline Wellbutrin Currently in sinus rhythm, appropriately anticoagulated Will plan further follow- up pending labs.   Signed Abbe Amsterdam, MD  Received labs, message to patient  Results for orders placed or performed in visit on 05/21/21  CBC  Result Value Ref Range   WBC 7.8 4.0 - 10.5 K/uL   RBC 4.49 3.87 - 5.11 Mil/uL   Platelets 278.0 150.0 - 400.0 K/uL   Hemoglobin 13.7 12.0 - 15.0 g/dL   HCT 93.2 35.5 - 73.2 %   MCV 91.3 78.0 - 100.0 fl   MCHC 33.3 30.0 - 36.0 g/dL   RDW 20.2 54.2 - 70.6 %  Comprehensive metabolic panel  Result Value Ref Range   Sodium 139 135 - 145 mEq/L   Potassium 4.0 3.5 - 5.1 mEq/L   Chloride 106 96 - 112 mEq/L   CO2 26 19 - 32 mEq/L  Glucose, Bld 88 70 - 99 mg/dL   BUN 11 6 - 23 mg/dL   Creatinine, Ser 0.56 0.40 - 1.20 mg/dL   Total Bilirubin 0.5 0.2 - 1.2 mg/dL   Alkaline Phosphatase 47 39 - 117 U/L   AST 18 0 - 37 U/L   ALT 16 0 - 35 U/L   Total Protein 6.0 6.0 - 8.3 g/dL   Albumin 4.0 3.5 - 5.2 g/dL   GFR 94.32 >60.00 mL/min   Calcium 8.9 8.4 - 10.5 mg/dL  Hemoglobin A1c  Result Value Ref Range   Hgb A1c MFr Bld 5.5 4.6 - 6.5 %  Lipid panel  Result Value Ref Range   Cholesterol 173 0 - 200 mg/dL   Triglycerides 85.0 0.0 - 149.0 mg/dL   HDL 46.90 >39.00 mg/dL   VLDL 17.0 0.0 - 40.0 mg/dL   LDL Cholesterol 109 (H) 0 - 99 mg/dL   Total CHOL/HDL Ratio 4    NonHDL 126.33   TSH  Result Value Ref Range   TSH 1.41 0.35 - 5.50 uIU/mL  VITAMIN D 25 Hydroxy (Vit-D Deficiency, Fractures)  Result Value Ref Range   VITD 15.61 (L) 30.00 - 100.00 ng/mL

## 2021-05-16 ENCOUNTER — Other Ambulatory Visit: Payer: Self-pay | Admitting: Cardiology

## 2021-05-16 ENCOUNTER — Other Ambulatory Visit: Payer: Self-pay | Admitting: Family Medicine

## 2021-05-16 NOTE — Telephone Encounter (Signed)
Prescription refill request for Eliquis received. Indication:Afib Last office visit:1/22 Scr:0.6 Age: 67 Weight:84.8 kg  Prescription refilled

## 2021-05-21 ENCOUNTER — Encounter: Payer: Self-pay | Admitting: Family Medicine

## 2021-05-21 ENCOUNTER — Ambulatory Visit: Payer: Medicare PPO | Admitting: Family Medicine

## 2021-05-21 VITALS — BP 112/80 | HR 52 | Temp 98.2°F | Resp 18 | Ht 62.0 in | Wt 191.2 lb

## 2021-05-21 DIAGNOSIS — R5383 Other fatigue: Secondary | ICD-10-CM

## 2021-05-21 DIAGNOSIS — Z23 Encounter for immunization: Secondary | ICD-10-CM

## 2021-05-21 DIAGNOSIS — E782 Mixed hyperlipidemia: Secondary | ICD-10-CM | POA: Diagnosis not present

## 2021-05-21 DIAGNOSIS — Z1159 Encounter for screening for other viral diseases: Secondary | ICD-10-CM

## 2021-05-21 DIAGNOSIS — F3342 Major depressive disorder, recurrent, in full remission: Secondary | ICD-10-CM | POA: Diagnosis not present

## 2021-05-21 DIAGNOSIS — Z1231 Encounter for screening mammogram for malignant neoplasm of breast: Secondary | ICD-10-CM | POA: Diagnosis not present

## 2021-05-21 DIAGNOSIS — I4891 Unspecified atrial fibrillation: Secondary | ICD-10-CM

## 2021-05-21 DIAGNOSIS — E2839 Other primary ovarian failure: Secondary | ICD-10-CM

## 2021-05-21 DIAGNOSIS — E559 Vitamin D deficiency, unspecified: Secondary | ICD-10-CM

## 2021-05-21 DIAGNOSIS — Z131 Encounter for screening for diabetes mellitus: Secondary | ICD-10-CM | POA: Diagnosis not present

## 2021-05-21 LAB — COMPREHENSIVE METABOLIC PANEL
ALT: 16 U/L (ref 0–35)
AST: 18 U/L (ref 0–37)
Albumin: 4 g/dL (ref 3.5–5.2)
Alkaline Phosphatase: 47 U/L (ref 39–117)
BUN: 11 mg/dL (ref 6–23)
CO2: 26 mEq/L (ref 19–32)
Calcium: 8.9 mg/dL (ref 8.4–10.5)
Chloride: 106 mEq/L (ref 96–112)
Creatinine, Ser: 0.56 mg/dL (ref 0.40–1.20)
GFR: 94.32 mL/min (ref >60.00)
Glucose, Bld: 88 mg/dL (ref 70–99)
Potassium: 4 mEq/L (ref 3.5–5.1)
Sodium: 139 mEq/L (ref 135–145)
Total Bilirubin: 0.5 mg/dL (ref 0.2–1.2)
Total Protein: 6 g/dL (ref 6.0–8.3)

## 2021-05-21 LAB — CBC
HCT: 41 % (ref 36.0–46.0)
Hemoglobin: 13.7 g/dL (ref 12.0–15.0)
MCHC: 33.3 g/dL (ref 30.0–36.0)
MCV: 91.3 fl (ref 78.0–100.0)
Platelets: 278 10*3/uL (ref 150.0–400.0)
RBC: 4.49 Mil/uL (ref 3.87–5.11)
RDW: 13.9 % (ref 11.5–15.5)
WBC: 7.8 10*3/uL (ref 4.0–10.5)

## 2021-05-21 LAB — HEPATITIS C ANTIBODY
Hepatitis C Ab: NONREACTIVE
SIGNAL TO CUT-OFF: <0.02 (ref <1.00)

## 2021-05-21 LAB — LIPID PANEL
Cholesterol: 173 mg/dL (ref 0–200)
HDL: 46.9 mg/dL (ref >39.00)
LDL Cholesterol: 109 mg/dL — ABNORMAL HIGH (ref 0–99)
NonHDL: 126.33
Total CHOL/HDL Ratio: 4
Triglycerides: 85 mg/dL (ref 0.0–149.0)
VLDL: 17 mg/dL (ref 0.0–40.0)

## 2021-05-21 LAB — VITAMIN D 25 HYDROXY (VIT D DEFICIENCY, FRACTURES): VITD: 15.61 ng/mL — ABNORMAL LOW (ref 30.00–100.00)

## 2021-05-21 LAB — HEMOGLOBIN A1C: Hgb A1c MFr Bld: 5.5 % (ref 4.6–6.5)

## 2021-05-21 LAB — TSH: TSH: 1.41 u[IU]/mL (ref 0.35–5.50)

## 2021-05-21 MED ORDER — VITAMIN D3 1.25 MG (50000 UT) PO CAPS: ORAL_CAPSULE | ORAL | 0 refills | Status: DC

## 2021-05-21 MED ORDER — SERTRALINE HCL 50 MG PO TABS: 50.0000 mg | ORAL_TABLET | Freq: Every day | ORAL | 3 refills | Status: DC

## 2021-05-21 NOTE — Addendum Note (Signed)
Addended by: Abbe Amsterdam C on: 05/21/2021 06:12 PM   Modules accepted: Orders

## 2021-06-01 ENCOUNTER — Telehealth: Payer: Self-pay | Admitting: Family Medicine

## 2021-06-01 NOTE — Telephone Encounter (Signed)
Left message for patient to call back and schedule Medicare Annual Wellness Visit (AWV) in office.  ° °If not able to come in office, please offer to do virtually or by telephone.  Left office number and my jabber #[phone]. ° °Due for AWVI ° °Please schedule at anytime with Nurse Health Advisor. °  °

## 2021-06-04 ENCOUNTER — Encounter (HOSPITAL_BASED_OUTPATIENT_CLINIC_OR_DEPARTMENT_OTHER): Payer: Self-pay

## 2021-06-04 ENCOUNTER — Ambulatory Visit (HOSPITAL_BASED_OUTPATIENT_CLINIC_OR_DEPARTMENT_OTHER)
Admission: RE | Admit: 2021-06-04 | Discharge: 2021-06-04 | Disposition: A | Discharge status: Home / Self Care | Payer: Medicare PPO | Source: Ambulatory Visit | Attending: Family Medicine | Admitting: Family Medicine

## 2021-06-04 ENCOUNTER — Other Ambulatory Visit: Payer: Self-pay

## 2021-06-04 DIAGNOSIS — Z1231 Encounter for screening mammogram for malignant neoplasm of breast: Secondary | ICD-10-CM | POA: Diagnosis not present

## 2021-06-07 ENCOUNTER — Ambulatory Visit (INDEPENDENT_AMBULATORY_CARE_PROVIDER_SITE_OTHER): Payer: Medicare PPO

## 2021-06-07 VITALS — Ht 62.0 in | Wt 184.0 lb

## 2021-06-07 DIAGNOSIS — Z0000 Encounter for general adult medical examination without abnormal findings: Secondary | ICD-10-CM

## 2021-06-07 NOTE — Progress Notes (Signed)
Subjective:   Beth Arroyo is a 66 y.o. female who presents for an Initial Medicare Annual Wellness Visit.   I connected with Dois DavenportSandra today by telephone and verified that I am speaking with the correct person using two identifiers. Location patient: home Location provider: work Persons participating in the virtual visit: patient, Engineer, civil (consulting)nurse.    I discussed the limitations, risks, security and privacy concerns of performing an evaluation and management service by telephone and the availability of in person appointments. I also discussed with the patient that there may be a patient responsible charge related to this service. The patient expressed understanding and verbally consented to this telephonic visit.    Interactive audio and video telecommunications were attempted between this provider and patient, however failed, due to patient having technical difficulties OR patient did not have access to video capability.  We continued and completed visit with audio only.  Some vital signs may be absent or patient reported.   Time Spent with patient on telephone encounter: 25 minutes  Review of Systems     Cardiac Risk Factors include: advanced age (>1155men, 53>65 women);hypertension;dyslipidemia;obesity (BMI >30kg/m2)     Objective:    Today's Vitals   06/07/21 0900  Weight: 184 lb (83.5 kg)  Height: 5\' 2"  (1.575 m)   Body mass index is 33.65 kg/m.  Advanced Directives 06/07/2021  Does Patient Have a Medical Advance Directive? No  Would patient like information on creating a medical advance directive? Yes (MAU/Ambulatory/Procedural Areas - Information given)    Current Medications (verified) Outpatient Encounter Medications as of 06/07/2021  Medication Sig   buPROPion (WELLBUTRIN) 75 MG tablet TAKE 1 TABLET BY MOUTH EVERY DAY   Cholecalciferol (VITAMIN D3) 1.25 MG (50000 UT) CAPS Take 1 weekly for 12 weeks   ELIQUIS 5 MG TABS tablet TAKE 1 TABLET BY MOUTH TWICE A DAY   metoprolol  succinate (TOPROL-XL) 50 MG 24 hr tablet TAKE 1 TABLET BY MOUTH EVERY DAY   omeprazole (PRILOSEC) 20 MG capsule Take 20 mg by mouth daily.   sertraline (ZOLOFT) 50 MG tablet Take 1 tablet (50 mg total) by mouth daily.   simvastatin (ZOCOR) 10 MG tablet TAKE 1 TABLET (10 MG TOTAL) BY MOUTH DAILY AT 6 PM.   No facility-administered encounter medications on file as of 06/07/2021.    Allergies (verified) Patient has no known allergies.   History: Past Medical History:  Diagnosis Date   Arrhythmia 05/24/2020   Depression    Essential hypertension    GERD (gastroesophageal reflux disease) 10/13/2015   Grief at loss of child 07/22/2019   Hyperlipidemia    Obesity (BMI 35.0-39.9 without comorbidity) 07/22/2019   Overweight 08/30/2019   Paroxysmal atrial fibrillation (HCC) 07/22/2019   Recurrent major depressive disorder, in full remission (HCC) 10/13/2015   Past Surgical History:  Procedure Laterality Date   CESAREAN SECTION  1988   Family History  Problem Relation Age of Onset   Pulmonary disease Mother    Diabetes Mother    Heart disease Father    Thyroid cancer Sister    Kidney cancer Sister    Social History   Socioeconomic History   Marital status: Divorced    Spouse name: Not on file   Number of children: Not on file   Years of education: Not on file   Highest education level: Bachelor's degree (e.g., BA, AB, BS)  Occupational History   Not on file  Tobacco Use   Smoking status: Former    Packs/day: 0.25  Years: 2.00    Pack years: 0.50    Types: Cigarettes    Quit date: 08/29/2008    Years since quitting: 12.7   Smokeless tobacco: Never  Substance and Sexual Activity   Alcohol use: Yes    Comment: rare   Drug use: Never   Sexual activity: Not on file  Other Topics Concern   Not on file  Social History Narrative   Not on file   Social Determinants of Health   Financial Resource Strain: Low Risk    Difficulty of Paying Living Expenses: Not hard at all  Food  Insecurity: No Food Insecurity   Worried About Programme researcher, broadcasting/film/video in the Last Year: Never true   Ran Out of Food in the Last Year: Never true  Transportation Needs: No Transportation Needs   Lack of Transportation (Medical): No   Lack of Transportation (Non-Medical): No  Physical Activity: Insufficiently Active   Days of Exercise per Week: 7 days   Minutes of Exercise per Session: 20 min  Stress: No Stress Concern Present   Feeling of Stress : Not at all  Social Connections: Moderately Integrated   Frequency of Communication with Friends and Family: More than three times a week   Frequency of Social Gatherings with Friends and Family: More than three times a week   Attends Religious Services: More than 4 times per year   Active Member of [employer] or Organizations: Yes   Attends Engineer, structural: More than 4 times per year   Marital Status: Divorced    Tobacco Counseling Counseling given: Not Answered   Clinical Intake:  Pre-visit preparation completed: Yes  Pain : No/denies pain     BMI - recorded: 33.65 Nutritional Status: BMI > 30  Obese Nutritional Risks: None Diabetes: No  How often do you need to have someone help you when you read instructions, pamphlets, or other written materials from your doctor or pharmacy?: 1 - Never  Diabetic?No  Interpreter Needed?: No  Information entered by :: Thomasenia Sales LPN   Activities of Daily Living In your present state of health, do you have any difficulty performing the following activities: 06/07/2021  Hearing? N  Vision? N  Difficulty concentrating or making decisions? N  Walking or climbing stairs? N  Dressing or bathing? N  Doing errands, shopping? N  Preparing Food and eating ? N  Using the Toilet? N  In the past six months, have you accidently leaked urine? N  Do you have problems with loss of bowel control? N  Managing your Medications? N  Managing your Finances? N  Housekeeping or managing your  Housekeeping? N  Some recent data might be hidden    Patient Care Team: Copland, Gwenlyn Found, MD as PCP - General (Family Medicine) Revankar, Aundra Dubin, MD as PCP - Cardiology (Cardiology)  Indicate any recent Medical Services you may have received from other than [facility] providers in the past year (date may be approximate).     Assessment:   This is a routine wellness examination for [place].  Hearing/Vision screen Hearing Screening - Comments:: C/o mild hearing loss Vision Screening - Comments:: Last eye exam-01/2021-  Dietary issues and exercise activities discussed: Current Exercise Habits: Home exercise routine, Type of exercise: walking, Time (Minutes): 20, Frequency (Times/Week): 7, Weekly Exercise (Minutes/Week): 140, Intensity: Mild, Exercise limited by: None identified   Goals Addressed             This Visit's Progress    Patient  Stated       Would like to lose some weight       Depression Screen PHQ 2/9 Scores 06/07/2021 03/15/2021 02/15/2021 01/10/2021 10/25/2020 10/04/2020 09/28/2020  PHQ - 2 Score 0 0 0 0 0 0 0    Fall Risk Fall Risk  06/07/2021 03/15/2021 02/15/2021 01/10/2021 11/30/2020  Falls in the past year? 0 0 0 0 0  Number falls in past yr: 0 - - 0 0  Injury with Fall? 0 - - 0 0  Risk for fall due to : - - - - -  Follow up Falls prevention discussed - - - -    FALL RISK PREVENTION PERTAINING TO THE HOME:  Any stairs in or around the home? Yes  If so, are there any without handrails? No  Home free of loose throw rugs in walkways, pet beds, electrical cords, etc? Yes  Adequate lighting in your home to reduce risk of falls? Yes   ASSISTIVE DEVICES UTILIZED TO PREVENT FALLS:  Life alert? No  Use of a cane, walker or w/c? No  Grab bars in the bathroom? No  Shower chair or bench in shower? No  Elevated toilet seat or a handicapped toilet? No   TIMED UP AND GO:  Was the test performed? No . Phone visit   Cognitive Function:Normal cognitive status assessed  by this Nurse Health Advisor. No abnormalities found.          Immunizations Immunization History  Administered Date(s) Administered   Influenza Split 04/14/2013   Influenza, High Dose Seasonal PF 01/21/2019   Influenza, Quadrivalent, Recombinant, Inj, Pf 01/29/2017   Influenza,inj,Quad PF,6-35 Mos 01/26/2018   Influenza-Unspecified 01/29/2020, 02/19/2021   PFIZER(Purple Top)SARS-COV-2 Vaccination 05/20/2019, 06/09/2019, 01/29/2020   PNEUMOCOCCAL CONJUGATE-20 05/21/2021   Pfizer Covid Bivalent Pediatric Vaccine(70mos to <12yrs) 02/19/2021   Tdap 10/13/2015    TDAP status: Up to date  Flu Vaccine status: Up to date  Pneumococcal vaccine status: Up to date  Covid-19 vaccine status: Information provided on how to obtain vaccines.   Qualifies for Shingles Vaccine? Yes   Zostavax completed No   Shingrix Completed?: No.    Education has been provided regarding the importance of this vaccine. Patient has been advised to call insurance company to determine out of pocket expense if they have not yet received this vaccine. Advised may also receive vaccine at local pharmacy or Health Dept. Verbalized acceptance and understanding.  Screening Tests Health Maintenance  Topic Date Due   Zoster Vaccines- Shingrix (1 of 2) Never done   MAMMOGRAM  06/05/2023   COLONOSCOPY (Pts 45-32yrs Insurance coverage will need to be confirmed)  09/22/2023   TETANUS/TDAP  10/12/2025   Pneumonia Vaccine 41+ Years old  Completed   INFLUENZA VACCINE  Completed   DEXA SCAN  Completed   COVID-19 Vaccine  Completed   Hepatitis C Screening  Completed   HPV VACCINES  Aged Out    Health Maintenance  Health Maintenance Due  Topic Date Due   Zoster Vaccines- Shingrix (1 of 2) Never done    Colorectal cancer screening: Type of screening: Colonoscopy. Completed 09/21/2013. Repeat every 10 years  Mammogram status: Completed 06/04/2021-bilateral. Repeat every year  Bone Density status: Completed 06/29/2020.  Results reflect: Bone density results: OSTEOPENIA. Repeat every 2 years.  Lung Cancer Screening: (Low Dose CT Chest recommended if Age 66-80 years, 30 pack-year currently smoking OR have quit w/in 15years.) does not qualify.     Additional Screening:  Hepatitis C Screening: Completed 05/21/2021  Vision Screening: Recommended annual ophthalmology exams for early detection of glaucoma and other disorders of the eye. Is the patient up to date with their annual eye exam?  Yes  Who is the provider or what is the name of the office in which the patient attends annual eye exams? [facility]   Dental Screening: Recommended annual dental exams for proper oral hygiene  Community Resource Referral / Chronic Care Management: CRR required this visit?  No   CCM required this visit?  No      Plan:     I have personally reviewed and noted the following in the patients chart:   Medical and social history Use of alcohol, tobacco or illicit drugs  Current medications and supplements including opioid prescriptions. Patient is not currently taking opioid prescriptions. Functional ability and status Nutritional status Physical activity Advanced directives List of other physicians Hospitalizations, surgeries, and ER visits in previous 12 months Vitals Screenings to include cognitive, depression, and falls Referrals and appointments  In addition, I have reviewed and discussed with patient certain preventive protocols, quality metrics, and best practice recommendations. A written personalized care plan for preventive services as well as general preventive health recommendations were provided to patient.   Due to this being a telephonic visit, the after visit summary with patients personalized plan was offered to patient via mail or my-chart.  Patient would like to access on my-chart.   Roanna Raider, LPN   09/29/6946  Nurse Health Advisor  Nurse Notes: None

## 2021-06-07 NOTE — Patient Instructions (Signed)
Beth Arroyo , Thank you for taking time to complete your Medicare Wellness Visit. I appreciate your ongoing commitment to your health goals. Please review the following plan we discussed and let me know if I can assist you in the future.   Screening recommendations/referrals: Colonoscopy: Completed 526/2015-Due 09/22/2023 Mammogram: Completed 06/04/2021-Due 06/04/2022 Bone Density: Completed 06/29/2020-Due 06/30/2022 Recommended yearly ophthalmology/optometry visit for glaucoma screening and checkup Recommended yearly dental visit for hygiene and checkup  Vaccinations: Influenza vaccine: Up to date Pneumococcal vaccine: Up to date Tdap vaccine: Up to date Shingles vaccine: Due-May obtain vaccine at your local pharmacy. Covid-19:Up to date  Advanced directives: Information mailed today.  Conditions/risks identified: See problem list  Next appointment: Follow up in one year for your annual wellness visit 06/10/2022 @ 9:00 (phone visit)   Preventive Care 65 Years and Older, Female Preventive care refers to lifestyle choices and visits with your health care provider that can promote health and wellness. What does preventive care include? A yearly physical exam. This is also called an annual well check. Dental exams once or twice a year. Routine eye exams. Ask your health care provider how often you should have your eyes checked. Personal lifestyle choices, including: Daily care of your teeth and gums. Regular physical activity. Eating a healthy diet. Avoiding tobacco and drug use. Limiting alcohol use. Practicing safe sex. Taking low-dose aspirin every day. Taking vitamin and mineral supplements as recommended by your health care provider. What happens during an annual well check? The services and screenings done by your health care provider during your annual well check will depend on your age, overall health, lifestyle risk factors, and family history of disease. Counseling  Your  health care provider may ask you questions about your: Alcohol use. Tobacco use. Drug use. Emotional well-being. Home and relationship well-being. Sexual activity. Eating habits. History of falls. Memory and ability to understand (cognition). Work and work Statistician. Reproductive health. Screening  You may have the following tests or measurements: Height, weight, and BMI. Blood pressure. Lipid and cholesterol levels. These may be checked every 5 years, or more frequently if you are over 67 years old. Skin check. Lung cancer screening. You may have this screening every year starting at age 43 if you have a 30-pack-year history of smoking and currently smoke or have quit within the past 15 years. Fecal occult blood test (FOBT) of the stool. You may have this test every year starting at age 57. Flexible sigmoidoscopy or colonoscopy. You may have a sigmoidoscopy every 5 years or a colonoscopy every 10 years starting at age 69. Hepatitis C blood test. Hepatitis B blood test. Sexually transmitted disease (STD) testing. Diabetes screening. This is done by checking your blood sugar (glucose) after you have not eaten for a while (fasting). You may have this done every 1-3 years. Bone density scan. This is done to screen for osteoporosis. You may have this done starting at age 40. Mammogram. This may be done every 1-2 years. Talk to your health care provider about how often you should have regular mammograms. Talk with your health care provider about your test results, treatment options, and if necessary, the need for more tests. Vaccines  Your health care provider may recommend certain vaccines, such as: Influenza vaccine. This is recommended every year. Tetanus, diphtheria, and acellular pertussis (Tdap, Td) vaccine. You may need a Td booster every 10 years. Zoster vaccine. You may need this after age 37. Pneumococcal 13-valent conjugate (PCV13) vaccine. One dose is recommended after  age  15. Pneumococcal polysaccharide (PPSV23) vaccine. One dose is recommended after age 7. Talk to your health care provider about which screenings and vaccines you need and how often you need them. This information is not intended to replace advice given to you by your health care provider. Make sure you discuss any questions you have with your health care provider. Document Released: 05/12/2015 Document Revised: 01/03/2016 Document Reviewed: 02/14/2015 Elsevier Interactive Patient Education  2017 [place] Prevention in the Home Falls can cause injuries. They can happen to people of all ages. There are many things you can do to make your home safe and to help prevent falls. What can I do on the outside of my home? Regularly fix the edges of walkways and driveways and fix any cracks. Remove anything that might make you trip as you walk through a door, such as a raised step or threshold. Trim any bushes or trees on the path to your home. Use bright outdoor lighting. Clear any walking paths of anything that might make someone trip, such as rocks or tools. Regularly check to see if handrails are loose or broken. Make sure that both sides of any steps have handrails. Any raised decks and porches should have guardrails on the edges. Have any leaves, snow, or ice cleared regularly. Use sand or salt on walking paths during winter. Clean up any spills in your garage right away. This includes oil or grease spills. What can I do in the bathroom? Use night lights. Install grab bars by the toilet and in the tub and shower. Do not use towel bars as grab bars. Use non-skid mats or decals in the tub or shower. If you need to sit down in the shower, use a plastic, non-slip stool. Keep the floor dry. Clean up any water that spills on the floor as soon as it happens. Remove soap buildup in the tub or shower regularly. Attach bath mats securely with double-sided non-slip rug tape. Do not have throw  rugs and other things on the floor that can make you trip. What can I do in the bedroom? Use night lights. Make sure that you have a light by your bed that is easy to reach. Do not use any sheets or blankets that are too big for your bed. They should not hang down onto the floor. Have a firm chair that has side arms. You can use this for support while you get dressed. Do not have throw rugs and other things on the floor that can make you trip. What can I do in the kitchen? Clean up any spills right away. Avoid walking on wet floors. Keep items that you use a lot in easy-to-reach places. If you need to reach something above you, use a strong step stool that has a grab bar. Keep electrical cords out of the way. Do not use floor polish or wax that makes floors slippery. If you must use wax, use non-skid floor wax. Do not have throw rugs and other things on the floor that can make you trip. What can I do with my stairs? Do not leave any items on the stairs. Make sure that there are handrails on both sides of the stairs and use them. Fix handrails that are broken or loose. Make sure that handrails are as long as the stairways. Check any carpeting to make sure that it is firmly attached to the stairs. Fix any carpet that is loose or worn. Avoid having throw rugs  at the top or bottom of the stairs. If you do have throw rugs, attach them to the floor with carpet tape. Make sure that you have a light switch at the top of the stairs and the bottom of the stairs. If you do not have them, ask someone to add them for you. What else can I do to help prevent falls? Wear shoes that: Do not have high heels. Have rubber bottoms. Are comfortable and fit you well. Are closed at the toe. Do not wear sandals. If you use a stepladder: Make sure that it is fully opened. Do not climb a closed stepladder. Make sure that both sides of the stepladder are locked into place. Ask someone to hold it for you, if  possible. Clearly mark and make sure that you can see: Any grab bars or handrails. First and last steps. Where the edge of each step is. Use tools that help you move around (mobility aids) if they are needed. These include: Canes. Walkers. Scooters. Crutches. Turn on the lights when you go into a dark area. Replace any light bulbs as soon as they burn out. Set up your furniture so you have a clear path. Avoid moving your furniture around. If any of your floors are uneven, fix them. If there are any pets around you, be aware of where they are. Review your medicines with your doctor. Some medicines can make you feel dizzy. This can increase your chance of falling. Ask your doctor what other things that you can do to help prevent falls. This information is not intended to replace advice given to you by your health care provider. Make sure you discuss any questions you have with your health care provider. Document Released: 02/09/2009 Document Revised: 09/21/2015 Document Reviewed: 05/20/2014 Elsevier Interactive Patient Education  2017 [employer].

## 2021-08-09 ENCOUNTER — Other Ambulatory Visit: Payer: Self-pay | Admitting: Family Medicine

## 2021-08-09 DIAGNOSIS — E559 Vitamin D deficiency, unspecified: Secondary | ICD-10-CM

## 2021-08-23 ENCOUNTER — Other Ambulatory Visit: Payer: Self-pay | Admitting: Family Medicine

## 2021-08-23 DIAGNOSIS — E559 Vitamin D deficiency, unspecified: Secondary | ICD-10-CM

## 2021-10-19 ENCOUNTER — Other Ambulatory Visit: Payer: Self-pay | Admitting: Family Medicine

## 2021-10-28 ENCOUNTER — Other Ambulatory Visit: Payer: Self-pay | Admitting: Family Medicine

## 2021-11-13 ENCOUNTER — Other Ambulatory Visit: Payer: Self-pay | Admitting: Family Medicine

## 2021-11-20 DIAGNOSIS — E669 Obesity, unspecified: Secondary | ICD-10-CM | POA: Diagnosis not present

## 2021-11-20 DIAGNOSIS — F419 Anxiety disorder, unspecified: Secondary | ICD-10-CM | POA: Diagnosis not present

## 2021-11-20 DIAGNOSIS — R030 Elevated blood-pressure reading, without diagnosis of hypertension: Secondary | ICD-10-CM | POA: Diagnosis not present

## 2021-11-20 DIAGNOSIS — K219 Gastro-esophageal reflux disease without esophagitis: Secondary | ICD-10-CM | POA: Diagnosis not present

## 2021-11-20 DIAGNOSIS — F325 Major depressive disorder, single episode, in full remission: Secondary | ICD-10-CM | POA: Diagnosis not present

## 2021-11-20 DIAGNOSIS — E785 Hyperlipidemia, unspecified: Secondary | ICD-10-CM | POA: Diagnosis not present

## 2021-11-20 DIAGNOSIS — I739 Peripheral vascular disease, unspecified: Secondary | ICD-10-CM | POA: Diagnosis not present

## 2021-11-20 DIAGNOSIS — I4891 Unspecified atrial fibrillation: Secondary | ICD-10-CM | POA: Diagnosis not present

## 2021-11-20 DIAGNOSIS — M1990 Unspecified osteoarthritis, unspecified site: Secondary | ICD-10-CM | POA: Diagnosis not present

## 2021-12-12 ENCOUNTER — Encounter: Payer: Self-pay | Admitting: Family Medicine

## 2021-12-12 DIAGNOSIS — I739 Peripheral vascular disease, unspecified: Secondary | ICD-10-CM

## 2021-12-21 ENCOUNTER — Encounter (HOSPITAL_COMMUNITY): Payer: Medicare PPO

## 2021-12-26 DIAGNOSIS — H524 Presbyopia: Secondary | ICD-10-CM | POA: Diagnosis not present

## 2021-12-26 DIAGNOSIS — Z135 Encounter for screening for eye and ear disorders: Secondary | ICD-10-CM | POA: Diagnosis not present

## 2021-12-26 DIAGNOSIS — H11153 Pinguecula, bilateral: Secondary | ICD-10-CM | POA: Diagnosis not present

## 2021-12-26 DIAGNOSIS — H2513 Age-related nuclear cataract, bilateral: Secondary | ICD-10-CM | POA: Diagnosis not present

## 2022-01-04 ENCOUNTER — Encounter: Payer: Self-pay | Admitting: Family Medicine

## 2022-01-04 ENCOUNTER — Ambulatory Visit (HOSPITAL_COMMUNITY)
Admission: RE | Admit: 2022-01-04 | Discharge: 2022-01-04 | Disposition: A | Discharge status: Home / Self Care | Payer: Medicare PPO | Source: Ambulatory Visit | Attending: Family Medicine | Admitting: Family Medicine

## 2022-01-04 DIAGNOSIS — I739 Peripheral vascular disease, unspecified: Secondary | ICD-10-CM | POA: Insufficient documentation

## 2022-01-24 NOTE — Telephone Encounter (Signed)
Opened in error

## 2022-03-05 DIAGNOSIS — L820 Inflamed seborrheic keratosis: Secondary | ICD-10-CM | POA: Diagnosis not present

## 2022-03-05 DIAGNOSIS — X32XXXS Exposure to sunlight, sequela: Secondary | ICD-10-CM | POA: Diagnosis not present

## 2022-03-05 DIAGNOSIS — E669 Obesity, unspecified: Secondary | ICD-10-CM | POA: Diagnosis not present

## 2022-03-05 DIAGNOSIS — L821 Other seborrheic keratosis: Secondary | ICD-10-CM | POA: Diagnosis not present

## 2022-03-05 DIAGNOSIS — L814 Other melanin hyperpigmentation: Secondary | ICD-10-CM | POA: Diagnosis not present

## 2022-03-05 DIAGNOSIS — D225 Melanocytic nevi of trunk: Secondary | ICD-10-CM | POA: Diagnosis not present

## 2022-04-15 ENCOUNTER — Other Ambulatory Visit: Payer: Self-pay | Admitting: Family Medicine

## 2022-05-01 ENCOUNTER — Encounter: Payer: Self-pay | Admitting: Family Medicine

## 2022-05-28 ENCOUNTER — Other Ambulatory Visit: Payer: Self-pay | Admitting: Family Medicine

## 2022-05-28 ENCOUNTER — Encounter: Payer: Self-pay | Admitting: *Deleted

## 2022-05-28 DIAGNOSIS — F3342 Major depressive disorder, recurrent, in full remission: Secondary | ICD-10-CM

## 2022-06-10 ENCOUNTER — Ambulatory Visit (INDEPENDENT_AMBULATORY_CARE_PROVIDER_SITE_OTHER): Payer: Medicare PPO | Admitting: *Deleted

## 2022-06-10 ENCOUNTER — Ambulatory Visit: Payer: Medicare PPO

## 2022-06-10 DIAGNOSIS — Z0000 Encounter for general adult medical examination without abnormal findings: Secondary | ICD-10-CM

## 2022-06-10 NOTE — Progress Notes (Signed)
Subjective:   Beth Arroyo is a 67 y.o. female who presents for Medicare Annual (Subsequent) preventive examination.  I connected with  Beth Arroyo on 06/10/22 by a audio enabled telemedicine application and verified that I am speaking with the correct person using two identifiers.  Patient Location: Home  Provider Location: Office/Clinic  I discussed the limitations of evaluation and management by telemedicine. The patient expressed understanding and agreed to proceed.   Review of Systems    Defer to PCP Cardiac Risk Factors include: advanced age (>5mn, >>10women);dyslipidemia;hypertension     Objective:    There were no vitals filed for this visit. There is no height or weight on file to calculate BMI.     06/10/2022    8:59 AM 06/07/2021    9:07 AM  Advanced Directives  Does Patient Have a Medical Advance Directive? Yes No  Type of Advance Directive HHillsviewin Chart? No - copy requested   Would patient like information on creating a medical advance directive?  Yes (MAU/Ambulatory/Procedural Areas - Information given)    Current Medications (verified) Outpatient Encounter Medications as of 06/10/2022  Medication Sig   buPROPion (WELLBUTRIN) 75 MG tablet TAKE 1 TABLET BY MOUTH EVERY DAY   Cholecalciferol (VITAMIN D3) 1.25 MG (50000 UT) CAPS Take 1 capsule by mouth once a week.   ELIQUIS 5 MG TABS tablet TAKE 1 TABLET BY MOUTH TWICE A DAY   metoprolol succinate (TOPROL-XL) 50 MG 24 hr tablet Take 1 tablet (50 mg total) by mouth daily.   omeprazole (PRILOSEC) 20 MG capsule Take 20 mg by mouth daily.   sertraline (ZOLOFT) 50 MG tablet TAKE 1 TABLET BY MOUTH EVERY DAY   simvastatin (ZOCOR) 10 MG tablet TAKE 1 TABLET (10 MG TOTAL) BY MOUTH DAILY AT 6 PM.   No facility-administered encounter medications on file as of 06/10/2022.    Allergies (verified) Patient has no known allergies.   History: Past  Medical History:  Diagnosis Date   Arrhythmia 05/24/2020   Depression    Essential hypertension    GERD (gastroesophageal reflux disease) 10/13/2015   Grief at loss of child 07/22/2019   Hyperlipidemia    Obesity (BMI 35.0-39.9 without comorbidity) 07/22/2019   Overweight 08/30/2019   Paroxysmal atrial fibrillation ([facility] 07/22/2019   Recurrent major depressive disorder, in full remission ([facility] 10/13/2015   Past Surgical History:  Procedure Laterality Date   CESAREAN SECTION  1988   Family History  Problem Relation Age of Onset   Pulmonary disease Mother    Diabetes Mother    Heart disease Father    Thyroid cancer Sister    Kidney cancer Sister    Social History   Socioeconomic History   Marital status: Divorced    Spouse name: Not on file   Number of children: Not on file   Years of education: Not on file   Highest education level: Bachelor's degree (e.g., BA, AB, BS)  Occupational History   Not on file  Tobacco Use   Smoking status: Former    Packs/day: 0.25    Years: 2.00    Total pack years: 0.50    Types: Cigarettes    Quit date: 08/29/2008    Years since quitting: 13.7   Smokeless tobacco: Never  Substance and Sexual Activity   Alcohol use: Yes    Comment: rare   Drug use: Never   Sexual activity: Not on file  Other Topics  Concern   Not on file  Social History Narrative   Not on file   Social Determinants of Health   Financial Resource Strain: Low Risk  (06/07/2021)   Overall Financial Resource Strain (CARDIA)    Difficulty of Paying Living Expenses: Not hard at all  Food Insecurity: No Food Insecurity (06/10/2022)   Hunger Vital Sign    Worried About Running Out of Food in the Last Year: Never true    Ran Out of Food in the Last Year: Never true  Transportation Needs: No Transportation Needs (06/10/2022)   PRAPARE - Hydrologist (Medical): No    Lack of Transportation (Non-Medical): No  Physical Activity: Insufficiently Active  (06/07/2021)   Exercise Vital Sign    Days of Exercise per Week: 7 days    Minutes of Exercise per Session: 20 min  Stress: No Stress Concern Present (06/07/2021)   [place]    Feeling of Stress : Not at all  Social Connections: Moderately Integrated (06/07/2021)   Social Connection and Isolation Panel [NHANES]    Frequency of Communication with Friends and Family: More than three times a week    Frequency of Social Gatherings with Friends and Family: More than three times a week    Attends Religious Services: More than 4 times per year    Active Member of [employer] or Organizations: Yes    Attends Music therapist: More than 4 times per year    Marital Status: Divorced    Tobacco Counseling Counseling given: Not Answered   Clinical Intake:  Pre-visit preparation completed: Yes  Pain : No/denies pain  Diabetes: No  How often do you need to have someone help you when you read instructions, pamphlets, or other written materials from your doctor or pharmacy?: 1 - Never Activities of Daily Living    06/10/2022    9:03 AM  In your present state of health, do you have any difficulty performing the following activities:  Hearing? 1  Comment slight hearing loss  Vision? 0  Difficulty concentrating or making decisions? 0  Walking or climbing stairs? 0  Dressing or bathing? 0  Doing errands, shopping? 0  Preparing Food and eating ? N  Using the Toilet? N  In the past six months, have you accidently leaked urine? N  Do you have problems with loss of bowel control? N  Managing your Medications? N  Managing your Finances? N  Housekeeping or managing your Housekeeping? N    Patient Care Team: Copland, Gay Filler, MD as PCP - General (Family Medicine) Revankar, Reita Cliche, MD as PCP - Cardiology (Cardiology)  Indicate any recent Medical Services you may have received from other than [facility] providers in the past  year (date may be approximate).     Assessment:   This is a routine wellness examination for Beth Arroyo.  Hearing/Vision screen No results found.  Dietary issues and exercise activities discussed: Current Exercise Habits: Home exercise routine, Type of exercise: walking;stretching, Time (Minutes): 20, Frequency (Times/Week): 5, Weekly Exercise (Minutes/Week): 100, Intensity: Mild, Exercise limited by: None identified   Goals Addressed   None    Depression Screen    06/10/2022    9:02 AM 06/07/2021    9:11 AM 03/15/2021   10:00 AM 02/15/2021   10:13 AM 01/10/2021    9:43 AM 10/25/2020   10:33 AM 10/04/2020    9:11 AM  PHQ 2/9 Scores  PHQ -  2 Score 0 0 0 0 0 0 0    Fall Risk    06/10/2022    9:00 AM 06/07/2021    9:10 AM 03/15/2021   10:00 AM 02/15/2021   10:13 AM 01/10/2021    9:43 AM  Fall Risk   Falls in the past year? 0 0 0 0 0  Number falls in past yr: 0 0   0  Injury with Fall? 0 0   0  Risk for fall due to : No Fall Risks      Follow up Falls evaluation completed Falls prevention discussed       FALL RISK PREVENTION PERTAINING TO THE HOME:  Any stairs in or around the home? Yes  If so, are there any without handrails? No  Home free of loose throw rugs in walkways, pet beds, electrical cords, etc? Yes  Adequate lighting in your home to reduce risk of falls? Yes   ASSISTIVE DEVICES UTILIZED TO PREVENT FALLS:  Life alert? No  Use of a cane, walker or w/c? No  Grab bars in the bathroom? No  Shower chair or bench in shower? No  Elevated toilet seat or a handicapped toilet? No   TIMED UP AND GO:  Was the test performed?  No, audio visit .   Cognitive Function:        06/10/2022    9:08 AM  6CIT Screen  What Year? 0 points  What month? 0 points  What time? 0 points  Count back from 20 0 points  Months in reverse 0 points  Repeat phrase 2 points  Total Score 2 points    Immunizations Immunization History  Administered Date(s) Administered   Influenza  Split 04/14/2013   Influenza, High Dose Seasonal PF 01/21/2019   Influenza, Quadrivalent, Recombinant, Inj, Pf 01/29/2017   Influenza,inj,Quad PF,6-35 Mos 01/26/2018   Influenza-Unspecified 01/29/2020, 02/19/2021   PFIZER(Purple Top)SARS-COV-2 Vaccination 05/20/2019, 06/09/2019, 01/29/2020   PNEUMOCOCCAL CONJUGATE-20 05/21/2021   [place] Bivalent Pediatric Vaccine(3mo to <573yr 02/19/2021   Tdap 10/13/2015    TDAP status: Up to date  Flu Vaccine status: Due, Education has been provided regarding the importance of this vaccine. Advised may receive this vaccine at local pharmacy or Health Dept. Aware to provide a copy of the vaccination record if obtained from local pharmacy or Health Dept. Verbalized acceptance and understanding.  Pneumococcal vaccine status: Up to date  Covid-19 vaccine status: Information provided on how to obtain vaccines.   Qualifies for Shingles Vaccine? Yes   Zostavax completed No   Shingrix Completed?: No.    Education has been provided regarding the importance of this vaccine. Patient has been advised to call insurance company to determine out of pocket expense if they have not yet received this vaccine. Advised may also receive vaccine at local pharmacy or Health Dept. Verbalized acceptance and understanding.  Screening Tests Health Maintenance  Topic Date Due   Zoster Vaccines- Shingrix (1 of 2) Never done   COVID-19 Vaccine (5 - 2023-24 season) 12/28/2021   Medicare Annual Wellness (AWV)  06/07/2022   INFLUENZA VACCINE  07/28/2022 (Originally 11/27/2021)   MAMMOGRAM  06/05/2023   COLONOSCOPY (Pts [id number] coverage will need to be confirmed)  09/22/2023   DTaP/Tdap/Td (2 - Td or Tdap) 10/12/2025   Pneumonia Vaccine 65+33ears old  Completed   DEXA SCAN  Completed   Hepatitis C Screening  Completed   HPV VACCINES  Aged Out    Health Maintenance  Health Maintenance Due  Topic Date Due   Zoster Vaccines- Shingrix (1 of 2) Never done    COVID-19 Vaccine (5 - 2023-24 season) 12/28/2021   Medicare Annual Wellness (AWV)  06/07/2022    Colorectal cancer screening: Type of screening: Colonoscopy. Completed 09/21/13. Repeat every 10 years  Mammogram status: Completed 06/04/21. Repeat every year  Bone Density status: Completed 06/29/20. Results reflect: Bone density results: OSTEOPENIA. Repeat every 2 years.  Lung Cancer Screening: (Low Dose CT Chest recommended if Age 32-80 years, 30 pack-year currently smoking OR have quit w/in 15years.) does not qualify.   Additional Screening:  Hepatitis C Screening: does qualify; Completed 05/21/21  Vision Screening: Recommended annual ophthalmology exams for early detection of glaucoma and other disorders of the eye. Is the patient up to date with their annual eye exam?  Yes  Who is the provider or what is the name of the office in which the patient attends annual eye exams? [facility] If pt is not established with a provider, would they like to be referred to a provider to establish care? No .   Dental Screening: Recommended annual dental exams for proper oral hygiene  Community Resource Referral / Chronic Care Management: CRR required this visit?  No   CCM required this visit?  No      Plan:     I have personally reviewed and noted the following in the patient's chart:   Medical and social history Use of alcohol, tobacco or illicit drugs  Current medications and supplements including opioid prescriptions. Patient is not currently taking opioid prescriptions. Functional ability and status Nutritional status Physical activity Advanced directives List of other physicians Hospitalizations, surgeries, and ER visits in previous 12 months Vitals Screenings to include cognitive, depression, and falls Referrals and appointments  In addition, I have reviewed and discussed with patient certain preventive protocols, quality metrics, and best practice recommendations. A written  personalized care plan for preventive services as well as general preventive health recommendations were provided to patient.   Due to this being a telephonic visit, the after visit summary with patients personalized plan was offered to patient via mail or my-chart. Patient would like to access on my-chart.  Beatris Ship, [place]   06/10/2022   Nurse Notes: None

## 2022-06-10 NOTE — Patient Instructions (Signed)
Beth Arroyo , Thank you for taking time to come for your Medicare Wellness Visit. I appreciate your ongoing commitment to your health goals. Please review the following plan we discussed and let me know if I can assist you in the future.   These are the goals we discussed:  Goals      Patient Stated     Would like to lose some weight        This is a list of the screening recommended for you and due dates:  Health Maintenance  Topic Date Due   Zoster (Shingles) Vaccine (1 of 2) Never done   COVID-19 Vaccine (5 - 2023-24 season) 12/28/2021   Flu Shot  07/28/2022*   Mammogram  06/05/2023   Medicare Annual Wellness Visit  06/11/2023   Colon Cancer Screening  09/22/2023   DTaP/Tdap/Td vaccine (2 - Td or Tdap) 10/12/2025   Pneumonia Vaccine  Completed   DEXA scan (bone density measurement)  Completed   Hepatitis C Screening: USPSTF Recommendation to screen - Ages 85-79 yo.  Completed   HPV Vaccine  Aged Out  *Topic was postponed. The date shown is not the original due date.     Next appointment: Follow up in one year for your annual wellness visit.   Preventive Care 37 Years and Older, Female Preventive care refers to lifestyle choices and visits with your health care provider that can promote health and wellness. What does preventive care include? A yearly physical exam. This is also called an annual well check. Dental exams once or twice a year. Routine eye exams. Ask your health care provider how often you should have your eyes checked. Personal lifestyle choices, including: Daily care of your teeth and gums. Regular physical activity. Eating a healthy diet. Avoiding tobacco and drug use. Limiting alcohol use. Practicing safe sex. Taking low-dose aspirin every day. Taking vitamin and mineral supplements as recommended by your health care provider. What happens during an annual well check? The services and screenings done by your health care provider during your  annual well check will depend on your age, overall health, lifestyle risk factors, and family history of disease. Counseling  Your health care provider may ask you questions about your: Alcohol use. Tobacco use. Drug use. Emotional well-being. Home and relationship well-being. Sexual activity. Eating habits. History of falls. Memory and ability to understand (cognition). Work and work Statistician. Reproductive health. Screening  You may have the following tests or measurements: Height, weight, and BMI. Blood pressure. Lipid and cholesterol levels. These may be checked every 5 years, or more frequently if you are over 15 years old. Skin check. Lung cancer screening. You may have this screening every year starting at age 94 if you have a 30-pack-year history of smoking and currently smoke or have quit within the past 15 years. Fecal occult blood test (FOBT) of the stool. You may have this test every year starting at age 5. Flexible sigmoidoscopy or colonoscopy. You may have a sigmoidoscopy every 5 years or a colonoscopy every 10 years starting at age 49. Hepatitis C blood test. Hepatitis B blood test. Sexually transmitted disease (STD) testing. Diabetes screening. This is done by checking your blood sugar (glucose) after you have not eaten for a while (fasting). You may have this done every 1-3 years. Bone density scan. This is done to screen for osteoporosis. You may have this done starting at age 71. Mammogram. This may be done every 1-2 years. Talk to your health care provider  about how often you should have regular mammograms. Talk with your health care provider about your test results, treatment options, and if necessary, the need for more tests. Vaccines  Your health care provider may recommend certain vaccines, such as: Influenza vaccine. This is recommended every year. Tetanus, diphtheria, and acellular pertussis (Tdap, Td) vaccine. You may need a Td booster every 10  years. Zoster vaccine. You may need this after age 7. Pneumococcal 13-valent conjugate (PCV13) vaccine. One dose is recommended after age 14. Pneumococcal polysaccharide (PPSV23) vaccine. One dose is recommended after age 62. Talk to your health care provider about which screenings and vaccines you need and how often you need them. This information is not intended to replace advice given to you by your health care provider. Make sure you discuss any questions you have with your health care provider. Document Released: 05/12/2015 Document Revised: 01/03/2016 Document Reviewed: 02/14/2015 Elsevier Interactive Patient Education  2017 [place] Prevention in the Home Falls can cause injuries. They can happen to people of all ages. There are many things you can do to make your home safe and to help prevent falls. What can I do on the outside of my home? Regularly fix the edges of walkways and driveways and fix any cracks. Remove anything that might make you trip as you walk through a door, such as a raised step or threshold. Trim any bushes or trees on the path to your home. Use bright outdoor lighting. Clear any walking paths of anything that might make someone trip, such as rocks or tools. Regularly check to see if handrails are loose or broken. Make sure that both sides of any steps have handrails. Any raised decks and porches should have guardrails on the edges. Have any leaves, snow, or ice cleared regularly. Use sand or salt on walking paths during winter. Clean up any spills in your garage right away. This includes oil or grease spills. What can I do in the bathroom? Use night lights. Install grab bars by the toilet and in the tub and shower. Do not use towel bars as grab bars. Use non-skid mats or decals in the tub or shower. If you need to sit down in the shower, use a plastic, non-slip stool. Keep the floor dry. Clean up any water that spills on the floor as soon as it  happens. Remove soap buildup in the tub or shower regularly. Attach bath mats securely with double-sided non-slip rug tape. Do not have throw rugs and other things on the floor that can make you trip. What can I do in the bedroom? Use night lights. Make sure that you have a light by your bed that is easy to reach. Do not use any sheets or blankets that are too big for your bed. They should not hang down onto the floor. Have a firm chair that has side arms. You can use this for support while you get dressed. Do not have throw rugs and other things on the floor that can make you trip. What can I do in the kitchen? Clean up any spills right away. Avoid walking on wet floors. Keep items that you use a lot in easy-to-reach places. If you need to reach something above you, use a strong step stool that has a grab bar. Keep electrical cords out of the way. Do not use floor polish or wax that makes floors slippery. If you must use wax, use non-skid floor wax. Do not have throw rugs and  other things on the floor that can make you trip. What can I do with my stairs? Do not leave any items on the stairs. Make sure that there are handrails on both sides of the stairs and use them. Fix handrails that are broken or loose. Make sure that handrails are as long as the stairways. Check any carpeting to make sure that it is firmly attached to the stairs. Fix any carpet that is loose or worn. Avoid having throw rugs at the top or bottom of the stairs. If you do have throw rugs, attach them to the floor with carpet tape. Make sure that you have a light switch at the top of the stairs and the bottom of the stairs. If you do not have them, ask someone to add them for you. What else can I do to help prevent falls? Wear shoes that: Do not have high heels. Have rubber bottoms. Are comfortable and fit you well. Are closed at the toe. Do not wear sandals. If you use a stepladder: Make sure that it is fully opened.  Do not climb a closed stepladder. Make sure that both sides of the stepladder are locked into place. Ask someone to hold it for you, if possible. Clearly mark and make sure that you can see: Any grab bars or handrails. First and last steps. Where the edge of each step is. Use tools that help you move around (mobility aids) if they are needed. These include: Canes. Walkers. Scooters. Crutches. Turn on the lights when you go into a dark area. Replace any light bulbs as soon as they burn out. Set up your furniture so you have a clear path. Avoid moving your furniture around. If any of your floors are uneven, fix them. If there are any pets around you, be aware of where they are. Review your medicines with your doctor. Some medicines can make you feel dizzy. This can increase your chance of falling. Ask your doctor what other things that you can do to help prevent falls. This information is not intended to replace advice given to you by your health care provider. Make sure you discuss any questions you have with your health care provider. Document Released: 02/09/2009 Document Revised: 09/21/2015 Document Reviewed: 05/20/2014 Elsevier Interactive Patient Education  2017 [employer].

## 2022-06-14 NOTE — Progress Notes (Unsigned)
[facility] [address] [place], [place] [address] [id number] [phone]  Date:  06/17/2022   Name:  Kaleema Vitullo   DOB:  [birth year]   MRN:  [id number]  PCP:  Darreld Mclean, MD    Chief Complaint: No chief complaint on file.   History of Present Illness:  Jobi Brazel is a 67 y.o. 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   Tenosynovitis of right hand 09/21/2020   Osteopenia 06/30/2020   Arrhythmia 05/24/2020   Depression    Essential hypertension 08/30/2019   Overweight 08/30/2019   Paroxysmal atrial fibrillation ([place]) 07/22/2019   Hyperlipidemia 07/22/2019   Obesity (BMI 35.0-39.9 without comorbidity) 07/22/2019   Grief at loss of child 07/22/2019   GERD (gastroesophageal reflux disease) 10/13/2015   Recurrent major depressive disorder, in full remission ([facility]) 10/13/2015    Past Medical History:  Diagnosis Date   Arrhythmia 05/24/2020   Depression    Essential hypertension    GERD (gastroesophageal reflux disease) 10/13/2015   Grief at loss of child 07/22/2019   Hyperlipidemia    Obesity (BMI 35.0-39.9 without comorbidity) 07/22/2019   Overweight 08/30/2019   Paroxysmal atrial fibrillation ([place]) 07/22/2019   Recurrent major depressive disorder, in full remission ([place]) 10/13/2015    Past Surgical History:  Procedure Laterality  Date   CESAREAN SECTION  1988    Social History   Tobacco Use   Smoking status: Former    Packs/day: 0.25    Years: 2.00    Total pack years: 0.50    Types: Cigarettes    Quit date: 08/29/2008    Years since quitting: 13.8   Smokeless tobacco: Never  Substance Use Topics   Alcohol use: Yes    Comment: rare   Drug use: Never    Family History  Problem Relation Age of Onset   Pulmonary disease Mother    Diabetes Mother    Heart disease Father    Thyroid cancer Sister    Kidney cancer Sister     No Known Allergies  Medication list has been reviewed and updated.  Current Outpatient Medications on File Prior to Visit  Medication Sig Dispense Refill   buPROPion (WELLBUTRIN) 75 MG tablet TAKE 1 TABLET BY MOUTH EVERY DAY 90 tablet 3   Cholecalciferol (VITAMIN D3) 1.25 MG (50000 UT) CAPS Take 1 capsule by mouth once a week. 4 capsule 0   ELIQUIS 5 MG TABS tablet TAKE 1 TABLET BY MOUTH TWICE A DAY 180 tablet 3   metoprolol succinate (TOPROL-XL) 50 MG 24 hr tablet Take 1 tablet (50 mg total) by mouth daily. 90 tablet 0   omeprazole (PRILOSEC) 20 MG capsule Take 20 mg by mouth daily.     sertraline (ZOLOFT) 50 MG tablet TAKE 1 TABLET BY MOUTH EVERY DAY 90 tablet 0   simvastatin (ZOCOR) 10 MG tablet TAKE 1 TABLET (10 MG TOTAL) BY  MOUTH DAILY AT 6 PM. 90 tablet 0   No current facility-administered medications on file prior to visit.    Review of Systems:  As per HPI- otherwise negative.   Physical Examination: There were no vitals filed for this visit. There were no vitals filed for this visit. There is no height or weight on file to calculate BMI. Ideal Body Weight:    GEN: no acute distress. HEENT: Atraumatic, Normocephalic.  Ears and Nose: No external deformity. CV: RRR, No M/G/R. No JVD. No thrill. No extra heart sounds. PULM: CTA B, no wheezes, crackles, rhonchi. No retractions. No resp. distress. No accessory muscle use. ABD: S, NT, ND, +BS. No rebound. No  HSM. EXTR: No c/c/e PSYCH: Normally interactive. Conversant.    Assessment and Plan: ***  Signed Lamar Blinks, MD

## 2022-06-17 ENCOUNTER — Ambulatory Visit (INDEPENDENT_AMBULATORY_CARE_PROVIDER_SITE_OTHER): Payer: Medicare PPO | Admitting: Family Medicine

## 2022-06-17 ENCOUNTER — Encounter: Payer: Self-pay | Admitting: Family Medicine

## 2022-06-17 ENCOUNTER — Other Ambulatory Visit (HOSPITAL_BASED_OUTPATIENT_CLINIC_OR_DEPARTMENT_OTHER): Payer: Self-pay

## 2022-06-17 VITALS — BP 114/70 | HR 50 | Temp 98.0°F | Resp 18 | Ht 62.0 in | Wt 188.8 lb

## 2022-06-17 DIAGNOSIS — E6609 Other obesity due to excess calories: Secondary | ICD-10-CM

## 2022-06-17 DIAGNOSIS — Z131 Encounter for screening for diabetes mellitus: Secondary | ICD-10-CM | POA: Diagnosis not present

## 2022-06-17 DIAGNOSIS — Z6834 Body mass index (BMI) 34.0-34.9, adult: Secondary | ICD-10-CM

## 2022-06-17 DIAGNOSIS — Z1231 Encounter for screening mammogram for malignant neoplasm of breast: Secondary | ICD-10-CM

## 2022-06-17 DIAGNOSIS — E782 Mixed hyperlipidemia: Secondary | ICD-10-CM

## 2022-06-17 DIAGNOSIS — E559 Vitamin D deficiency, unspecified: Secondary | ICD-10-CM | POA: Diagnosis not present

## 2022-06-17 DIAGNOSIS — I4891 Unspecified atrial fibrillation: Secondary | ICD-10-CM | POA: Diagnosis not present

## 2022-06-17 DIAGNOSIS — Z23 Encounter for immunization: Secondary | ICD-10-CM

## 2022-06-17 DIAGNOSIS — F3342 Major depressive disorder, recurrent, in full remission: Secondary | ICD-10-CM

## 2022-06-17 LAB — LIPID PANEL
Cholesterol: 163 mg/dL (ref 0–200)
HDL: 44.9 mg/dL (ref >39.00)
LDL Cholesterol: 104 mg/dL — ABNORMAL HIGH (ref 0–99)
NonHDL: 118.43
Total CHOL/HDL Ratio: 4
Triglycerides: 70 mg/dL (ref 0.0–149.0)
VLDL: 14 mg/dL (ref 0.0–40.0)

## 2022-06-17 LAB — CBC
HCT: 43 % (ref 36.0–46.0)
Hemoglobin: 14.3 g/dL (ref 12.0–15.0)
MCHC: 33.2 g/dL (ref 30.0–36.0)
MCV: 93.1 fl (ref 78.0–100.0)
Platelets: 290 10*3/uL (ref 150.0–400.0)
RBC: 4.61 Mil/uL (ref 3.87–5.11)
RDW: 13.6 % (ref 11.5–15.5)
WBC: 8.3 10*3/uL (ref 4.0–10.5)

## 2022-06-17 LAB — COMPREHENSIVE METABOLIC PANEL
ALT: 17 U/L (ref 0–35)
AST: 17 U/L (ref 0–37)
Albumin: 4.2 g/dL (ref 3.5–5.2)
Alkaline Phosphatase: 45 U/L (ref 39–117)
BUN: 12 mg/dL (ref 6–23)
CO2: 27 mEq/L (ref 19–32)
Calcium: 9.3 mg/dL (ref 8.4–10.5)
Chloride: 104 mEq/L (ref 96–112)
Creatinine, Ser: 0.69 mg/dL (ref 0.40–1.20)
GFR: 89.02 mL/min (ref >60.00)
Glucose, Bld: 92 mg/dL (ref 70–99)
Potassium: 4.2 mEq/L (ref 3.5–5.1)
Sodium: 139 mEq/L (ref 135–145)
Total Bilirubin: 0.8 mg/dL (ref 0.2–1.2)
Total Protein: 6.5 g/dL (ref 6.0–8.3)

## 2022-06-17 LAB — HEMOGLOBIN A1C: Hgb A1c MFr Bld: 5.7 % (ref 4.6–6.5)

## 2022-06-17 LAB — TSH: TSH: 1.19 u[IU]/mL (ref 0.35–5.50)

## 2022-06-17 LAB — VITAMIN D 25 HYDROXY (VIT D DEFICIENCY, FRACTURES): VITD: 28.25 ng/mL — ABNORMAL LOW (ref 30.00–100.00)

## 2022-06-17 MED ORDER — BUPROPION HCL 75 MG PO TABS: 75.0000 mg | ORAL_TABLET | Freq: Every day | ORAL | 3 refills | Status: DC

## 2022-06-17 MED ORDER — COMIRNATY 30 MCG/0.3ML IM SUSY
PREFILLED_SYRINGE | INTRAMUSCULAR | 0 refills | Status: DC
Start: 2022-06-17 — End: 2022-07-08
  Filled 2022-06-17: qty 0.3, 1d supply, fill #0

## 2022-06-17 MED ORDER — SIMVASTATIN 10 MG PO TABS: 10.0000 mg | ORAL_TABLET | Freq: Every day | ORAL | 3 refills | Status: DC

## 2022-06-17 MED ORDER — METOPROLOL SUCCINATE ER 50 MG PO TB24: 50.0000 mg | ORAL_TABLET | Freq: Every day | ORAL | 3 refills | Status: DC

## 2022-06-17 MED ORDER — APIXABAN 5 MG PO TABS: 5.0000 mg | ORAL_TABLET | Freq: Two times a day (BID) | ORAL | 3 refills | Status: DC

## 2022-06-17 MED ORDER — SERTRALINE HCL 50 MG PO TABS: 50.0000 mg | ORAL_TABLET | Freq: Every day | ORAL | 3 refills | Status: DC

## 2022-06-17 NOTE — Patient Instructions (Addendum)
It was good to see you today- I will be in touch with your labs asap Stop by imaging on the ground floor to set up your mammogram The pharmacy can do your covid booster Also recommend the shingles vaccine series at your convenience Flu shot today  GLP1 medication will probably help you lose weight- please check on 2 things Thyroid cancer- do not use these medications if you sister had MEDULLARY thyroid cancer Also check and see if your medicare rx plan will cover these drugs for weight loss (as opposed to using for diabetes)

## 2022-06-25 ENCOUNTER — Ambulatory Visit (HOSPITAL_BASED_OUTPATIENT_CLINIC_OR_DEPARTMENT_OTHER): Payer: Medicare PPO

## 2022-07-01 ENCOUNTER — Ambulatory Visit (HOSPITAL_BASED_OUTPATIENT_CLINIC_OR_DEPARTMENT_OTHER)
Admission: RE | Admit: 2022-07-01 | Discharge: 2022-07-01 | Disposition: A | Discharge status: Home / Self Care | Payer: Medicare PPO | Source: Ambulatory Visit | Attending: Family Medicine | Admitting: Family Medicine

## 2022-07-01 ENCOUNTER — Encounter (HOSPITAL_BASED_OUTPATIENT_CLINIC_OR_DEPARTMENT_OTHER): Payer: Self-pay

## 2022-07-01 ENCOUNTER — Encounter: Payer: Self-pay | Admitting: Family Medicine

## 2022-07-01 DIAGNOSIS — Z1231 Encounter for screening mammogram for malignant neoplasm of breast: Secondary | ICD-10-CM | POA: Diagnosis not present

## 2022-07-08 ENCOUNTER — Other Ambulatory Visit: Payer: Self-pay

## 2022-07-18 ENCOUNTER — Encounter: Payer: Self-pay | Admitting: Cardiology

## 2022-07-18 ENCOUNTER — Ambulatory Visit: Payer: Medicare PPO | Attending: Cardiology | Admitting: Cardiology

## 2022-07-18 VITALS — BP 132/74 | HR 47 | Ht 61.6 in | Wt 192.1 lb

## 2022-07-18 DIAGNOSIS — I480 Paroxysmal atrial fibrillation: Secondary | ICD-10-CM

## 2022-07-18 DIAGNOSIS — I10 Essential (primary) hypertension: Secondary | ICD-10-CM

## 2022-07-18 DIAGNOSIS — E782 Mixed hyperlipidemia: Secondary | ICD-10-CM

## 2022-07-18 DIAGNOSIS — E669 Obesity, unspecified: Secondary | ICD-10-CM | POA: Diagnosis not present

## 2022-07-18 NOTE — Progress Notes (Signed)
Cardiology Office Note:    Date:  07/18/2022   ID:  Beth Arroyo, DOB 1955/12/08, MRN [id number]  PCP:  Darreld Mclean, MD  Cardiologist:  Jenean Lindau, MD   Referring MD: Darreld Mclean, MD    ASSESSMENT:    1. Paroxysmal atrial fibrillation (HCC)   2. Essential hypertension   3. Mixed hyperlipidemia   4. Obesity (BMI 35.0-39.9 without comorbidity)    PLAN:    In order of problems listed above:  Primary prevention stressed with the patient.  Importance of compliance with diet medication stressed and patient verbalized standing. Paroxysmal atrial fibrillation:I discussed with the patient atrial fibrillation, disease process. Management and therapy including rate and rhythm control, anticoagulation benefits and potential risks were discussed extensively with the patient. Patient had multiple questions which were answered to patient's satisfaction. Essential hypertension: Blood pressure stable and diet was emphasized. Mixed dyslipidemia: Lipid lowering medications followed by primary care. Obesity: Weight reduction was stressed and diet was emphasized and she promises to do better. Patient will be seen in follow-up appointment in 12 months or earlier if the patient has any concerns.    Medication Adjustments/Labs and Tests Ordered: Current medicines are reviewed at length with the patient today.  Concerns regarding medicines are outlined above.  No orders of the defined types were placed in this encounter.  No orders of the defined types were placed in this encounter.    No chief complaint on file.    History of Present Illness:    Beth Arroyo is a 67 y.o. female.  Patient has past medical history of essential hypertension, paroxysmal atrial fibrillation, dyslipidemia and obesity.  She mentions to me that she walks about half an hour a day on a daily basis.  With this she has no chest pain orthopnea or PND.  At the time of my evaluation, the patient is  alert awake oriented and in no distress.  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   buPROPion (WELLBUTRIN) 75 MG tablet Take 1 tablet (75 mg total) by mouth daily.   Cholecalciferol (D3 2000) 50 MCG (2000 UT) CAPS Take 2,000 Units by mouth daily.   metoprolol succinate (TOPROL-XL) 50 MG 24 hr tablet Take 1 tablet (50 mg total) by mouth daily.   omeprazole (PRILOSEC) 20 MG capsule Take 20 mg by mouth daily.   sertraline (ZOLOFT) 50 MG tablet Take 1 tablet (50 mg total) by mouth daily. (Patient taking differently: Take 25 mg by mouth daily.)   simvastatin (ZOCOR) 10 MG tablet Take 1 tablet (10 mg total) by mouth daily at 6 PM.     Allergies:   Patient has no known allergies.   Social  History   Socioeconomic History   Marital status: Divorced    Spouse name: Not on file   Number of children: Not on file   Years of education: Not on file   Highest education level: Bachelor's degree (e.g., BA, AB, BS)  Occupational History   Not on file  Tobacco Use   Smoking status: Former    Packs/day: 0.25    Years: 2.00    Additional pack years: 0.00    Total pack years: 0.50    Types: Cigarettes     Quit date: 08/29/2008    Years since quitting: 13.8   Smokeless tobacco: Never  Substance and Sexual Activity   Alcohol use: Yes    Comment: rare   Drug use: Never   Sexual activity: Not on file  Other Topics Concern   Not on file  Social History Narrative   Not on file   Social Determinants of Health   Financial Resource Strain: Low Risk  (06/07/2021)   Overall Financial Resource Strain (CARDIA)    Difficulty of Paying Living Expenses: Not hard at all  Food Insecurity: No Food Insecurity (06/10/2022)   Hunger Vital Sign    Worried About Running Out of Food in the Last Year: Never true    Ran Out of Food in the Last Year: Never true  Transportation Needs: No Transportation Needs (06/10/2022)   PRAPARE - Hydrologist (Medical): No    Lack of Transportation (Non-Medical): No  Physical Activity: Insufficiently Active (06/07/2021)   Exercise Vital Sign    Days of Exercise per Week: 7 days    Minutes of Exercise per Session: 20 min  Stress: No Stress Concern Present (06/07/2021)   [place]    Feeling of Stress : Not at all  Social Connections: Moderately Integrated (06/07/2021)   Social Connection and Isolation Panel [NHANES]    Frequency of Communication with Friends and Family: More than three times a week    Frequency of Social Gatherings with Friends and Family: More than three times a week    Attends Religious Services: More than 4 times per year    Active Member of [employer] or Organizations: Yes    Attends Music therapist: More than 4 times per year    Marital Status: Divorced     Family History: The patient's family history includes Diabetes in her mother; Heart disease in her father; Kidney cancer in her sister; Pulmonary disease in her mother; Thyroid cancer in her sister.  ROS:   Please see the history of present illness.    All other systems reviewed and are  negative.  EKGs/Labs/Other Studies Reviewed:    The following studies were reviewed today: EKG reveals sinus rhythm and nonspecific ST-T changes.  Bradycardia.   Recent Labs: 06/17/2022: ALT 17; BUN 12; Creatinine, Ser 0.69; Hemoglobin 14.3; Platelets 290.0; Potassium 4.2; Sodium 139; TSH 1.19  Recent Lipid Panel    Component Value Date/Time   CHOL 163 06/17/2022 1131   CHOL 179 05/26/2020 1119   TRIG 70.0 06/17/2022 1131   HDL 44.90 06/17/2022 1131   HDL 52 05/26/2020 1119   CHOLHDL 4 06/17/2022 1131   VLDL 14.0 06/17/2022 1131   LDLCALC 104 (H) 06/17/2022 1131   LDLCALC 112 (H) 05/26/2020 1119    Physical Exam:    VS:  BP 132/74   Pulse (!) 47   Ht 5' 1.6" (1.565 m)   Wt 192  lb 1.3 oz (87.1 kg)   SpO2 95%   BMI 35.59 kg/m     Wt Readings from Last 3 Encounters:  07/18/22 192 lb 1.3 oz (87.1 kg)  06/17/22 188 lb 12.8 oz (85.6 kg)  06/07/21 184 lb (83.5 kg)     GEN: Patient is in no acute distress HEENT: Normal NECK: No JVD; No carotid bruits LYMPHATICS: No lymphadenopathy CARDIAC: Hear sounds regular, 2/6 systolic murmur at the apex. RESPIRATORY:  Clear to auscultation without rales, wheezing or rhonchi  ABDOMEN: Soft, non-tender, non-distended MUSCULOSKELETAL:  No edema; No deformity  SKIN: Warm and dry NEUROLOGIC:  Alert and oriented x 3 PSYCHIATRIC:  Normal affect   Signed, Jenean Lindau, MD  07/18/2022 11:17 AM    [place]

## 2022-07-18 NOTE — Patient Instructions (Signed)
Medication Instructions:  Your physician recommends that you continue on your current medications as directed. Please refer to the Current Medication list given to you today.  *If you need a refill on your cardiac medications before your next appointment, please call your pharmacy*   Lab Work: None ordered If you have labs (blood work) drawn today and your tests are completely normal, you will receive your results only by: MyChart Message (if you have MyChart) OR A paper copy in the mail If you have any lab test that is abnormal or we need to change your treatment, we will call you to review the results.   Testing/Procedures: None ordered   Follow-Up: At [facility], you and your health needs are our priority.  As part of our continuing mission to provide you with exceptional heart care, we have created designated Provider Care Teams.  These Care Teams include your primary Cardiologist (physician) and Advanced Practice Providers (APPs -  Physician Assistants and Nurse Practitioners) who all work together to provide you with the care you need, when you need it.  We recommend signing up for the patient portal called "MyChart".  Sign up information is provided on this After Visit Summary.  MyChart is used to connect with patients for Virtual Visits (Telemedicine).  Patients are able to view lab/test results, encounter notes, upcoming appointments, etc.  Non-urgent messages can be sent to your provider as well.   To learn more about what you can do with MyChart, go to [email or web address].    Your next appointment:   12 month(s)  The format for your next appointment:   In Person  Provider:   Rajan Revankar, MD    Other Instructions none  Important Information About Sugar      

## 2022-12-16 DIAGNOSIS — Z135 Encounter for screening for eye and ear disorders: Secondary | ICD-10-CM | POA: Diagnosis not present

## 2022-12-16 DIAGNOSIS — H524 Presbyopia: Secondary | ICD-10-CM | POA: Diagnosis not present

## 2022-12-16 DIAGNOSIS — H2513 Age-related nuclear cataract, bilateral: Secondary | ICD-10-CM | POA: Diagnosis not present

## 2023-05-18 ENCOUNTER — Other Ambulatory Visit: Payer: Self-pay | Admitting: Family Medicine

## 2023-05-18 DIAGNOSIS — F3342 Major depressive disorder, recurrent, in full remission: Secondary | ICD-10-CM

## 2023-05-18 DIAGNOSIS — I4891 Unspecified atrial fibrillation: Secondary | ICD-10-CM

## 2023-05-19 ENCOUNTER — Other Ambulatory Visit (HOSPITAL_BASED_OUTPATIENT_CLINIC_OR_DEPARTMENT_OTHER): Payer: Self-pay | Admitting: Family Medicine

## 2023-05-19 DIAGNOSIS — Z1231 Encounter for screening mammogram for malignant neoplasm of breast: Secondary | ICD-10-CM

## 2023-05-25 ENCOUNTER — Other Ambulatory Visit: Payer: Self-pay | Admitting: Family Medicine

## 2023-05-25 DIAGNOSIS — E782 Mixed hyperlipidemia: Secondary | ICD-10-CM

## 2023-06-12 ENCOUNTER — Ambulatory Visit: Payer: Medicare PPO

## 2023-06-12 VITALS — Ht 61.6 in | Wt 192.0 lb

## 2023-06-12 DIAGNOSIS — Z1211 Encounter for screening for malignant neoplasm of colon: Secondary | ICD-10-CM

## 2023-06-12 DIAGNOSIS — Z0000 Encounter for general adult medical examination without abnormal findings: Secondary | ICD-10-CM | POA: Diagnosis not present

## 2023-06-12 NOTE — Patient Instructions (Addendum)
Beth Arroyo , Thank you for taking time to come for your Medicare Wellness Visit. I appreciate your ongoing commitment to your health goals. Please review the following plan we discussed and let me know if I can assist you in the future.   Referrals/Orders/Follow-Ups/Clinician Recommendations:   This is a list of the screening recommended for you and due dates:  Health Maintenance  Topic Date Due   Zoster (Shingles) Vaccine (1 of 2) Never done   Flu Shot  11/28/2022   COVID-19 Vaccine (6 - 2024-25 season) 12/29/2022   Colon Cancer Screening  09/22/2023   Medicare Annual Wellness Visit  06/11/2024   Mammogram  06/30/2024   DTaP/Tdap/Td vaccine (2 - Td or Tdap) 10/12/2025   Pneumonia Vaccine  Completed   DEXA scan (bone density measurement)  Completed   Hepatitis C Screening  Completed   HPV Vaccine  Aged Out    Advanced directives: (Declined) Advance directive discussed with you today. Even though you declined this today, please call our office should you change your mind, and we can give you the proper paperwork for you to fill out.  Next Medicare Annual Wellness Visit scheduled for next year: Yes

## 2023-06-12 NOTE — Progress Notes (Signed)
Subjective:   Beth Arroyo is a 68 y.o. female who presents for Medicare Annual (Subsequent) preventive examination.  Visit Complete: Virtual I connected with  Denyse Amass on 06/12/23 by a audio enabled telemedicine application and verified that I am speaking with the correct person using two identifiers.  Patient Location: Home  Provider Location: Home Office  I discussed the limitations of evaluation and management by telemedicine. The patient expressed understanding and agreed to proceed.  Vital Signs: Because this visit was a virtual/telehealth visit, some criteria may be missing or patient reported. Any vitals not documented were not able to be obtained and vitals that have been documented are patient reported.  Patient Medicare AWV questionnaire was completed by the patient on 06/11/23; I have confirmed that all information answered by patient is correct and no changes since this date.  Cardiac Risk Factors include: advanced age (>54men, >70 women);hypertension     Objective:    Today's Vitals   06/12/23 0936  Weight: 192 lb (87.1 kg)  Height: 5' 1.6" (1.565 m)   Body mass index is 35.57 kg/m.     06/12/2023    9:44 AM 06/10/2022    8:59 AM 06/07/2021    9:07 AM  Advanced Directives  Does Patient Have a Medical Advance Directive? No Yes No  Type of Soil scientist of Healthcare Power of Attorney in Chart?  No - copy requested   Would patient like information on creating a medical advance directive? No - Patient declined  Yes (MAU/Ambulatory/Procedural Areas - Information given)    Current Medications (verified) Outpatient Encounter Medications as of 06/12/2023  Medication Sig   apixaban (ELIQUIS) 5 MG TABS tablet Take 1 tablet (5 mg total) by mouth 2 (two) times daily. Appt for refills   buPROPion (WELLBUTRIN) 75 MG tablet Take 1 tablet (75 mg total) by mouth daily. Appt for refills   Cholecalciferol (D3 2000) 50 MCG  (2000 UT) CAPS Take 2,000 Units by mouth daily.   Cholecalciferol (VITAMIN D3) 1.25 MG (50000 UT) CAPS Take 1 capsule by mouth once a week. (Patient not taking: Reported on 07/18/2022)   metoprolol succinate (TOPROL-XL) 50 MG 24 hr tablet Take 1 tablet (50 mg total) by mouth daily.   omeprazole (PRILOSEC) 20 MG capsule Take 20 mg by mouth daily.   sertraline (ZOLOFT) 50 MG tablet Take 1 tablet (50 mg total) by mouth daily. (Patient taking differently: Take 25 mg by mouth daily.)   simvastatin (ZOCOR) 10 MG tablet TAKE 1 TABLET (10 MG TOTAL) BY MOUTH DAILY AT 6 PM.   No facility-administered encounter medications on file as of 06/12/2023.    Allergies (verified) Patient has no known allergies.   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   Obesity (BMI 35.0-39.9 without comorbidity) 07/22/2019   Osteopenia 06/30/2020   Overweight 08/30/2019   Paroxysmal atrial fibrillation (HCC) 07/22/2019   Recurrent major depressive disorder, in full remission (HCC) 10/13/2015   Tenosynovitis of right hand 09/21/2020   Past Surgical History:  Procedure Laterality Date   CESAREAN SECTION  1988   Family History  Problem Relation Age of Onset   Pulmonary disease Mother  Diabetes Mother    Heart disease Father    Thyroid cancer Sister    Kidney cancer Sister    Social History   Socioeconomic History   Marital status: Divorced    Spouse name: Not on file   Number of children: Not on file   Years of education: Not on file   Highest education level: Bachelor's degree (e.g., BA, AB, BS)  Occupational History   Not on file  Tobacco Use   Smoking status: Former    Current packs/day: 0.00     Average packs/day: 0.3 packs/day for 2.0 years (0.5 ttl pk-yrs)    Types: Cigarettes    Start date: 08/30/2006    Quit date: 08/29/2008    Years since quitting: 14.7   Smokeless tobacco: Never  Substance and Sexual Activity   Alcohol use: Yes    Comment: rare   Drug use: Never   Sexual activity: Not on file  Other Topics Concern   Not on file  Social History Narrative   Not on file   Social Drivers of Health   Financial Resource Strain: Low Risk  (06/12/2023)   Overall Financial Resource Strain (CARDIA)    Difficulty of Paying Living Expenses: Not hard at all  Food Insecurity: No Food Insecurity (06/12/2023)   Hunger Vital Sign    Worried About Running Out of Food in the Last Year: Never true    Ran Out of Food in the Last Year: Never true  Transportation Needs: No Transportation Needs (06/12/2023)   PRAPARE - Administrator, Civil Service (Medical): No    Lack of Transportation (Non-Medical): No  Physical Activity: Insufficiently Active (06/12/2023)   Exercise Vital Sign    Days of Exercise per Week: 4 days    Minutes of Exercise per Session: 30 min  Stress: No Stress Concern Present (06/12/2023)   [employer] of Occupational Health - Occupational Stress Questionnaire    Feeling of Stress : Not at all  Social Connections: Unknown (06/12/2023)   Social Connection and Isolation Panel [NHANES]    Frequency of Communication with Friends and Family: More than three times a week    Frequency of Social Gatherings with Friends and Family: More than three times a week    Attends Religious Services: Patient declined    Database administrator or Organizations: Yes    Attends Banker Meetings: 1 to 4 times per year    Marital Status: Divorced    Tobacco Counseling Counseling given: Not Answered   Clinical Intake:  Pre-visit preparation completed: Yes  Pain : No/denies pain     BMI - recorded: 35.57 Nutritional Status: BMI > 30   Obese Nutritional Risks: None Diabetes: No  How often do you need to have someone help you when you read instructions, pamphlets, or other written materials from your doctor or pharmacy?: 1 - Never  Interpreter Needed?: No  Information entered by :: Theresa Mulligan LPN   Activities of Daily Living    06/12/2023    9:43 AM 06/11/2023   11:36 AM  In your present state of health, do you have any difficulty performing the following activities:  Hearing? 0 0  Vision? 0 0  Difficulty concentrating or making decisions? 0 0  Walking or climbing stairs? 0 0  Dressing or bathing? 0 0  Doing errands, shopping? 0 0  Preparing Food and eating ? N N  Using the Toilet? N N  In the past six months, have  you accidently leaked urine? N N  Do you have problems with loss of bowel control? N N  Managing your Medications? N N  Managing your Finances? N N  Housekeeping or managing your Housekeeping? N N    Patient Care Team: Copland, Gwenlyn Found, MD as PCP - General (Family Medicine) Revankar, Aundra Dubin, MD as PCP - Cardiology (Cardiology)  Indicate any recent Medical Services you may have received from other than [facility] providers in the past year (date may be approximate).     Assessment:   This is a routine wellness examination for Fergie.  Hearing/Vision screen Hearing Screening - Comments:: Denies hearing difficulties   Vision Screening - Comments:: Wears rx glasses - up to date with routine eye exams with  [facility]   Goals Addressed               This Visit's Progress     Increase physical activity (pt-stated)        Lose Weight.       Depression Screen    06/12/2023    9:41 AM 06/17/2022   10:53 AM 06/10/2022    9:02 AM 06/07/2021    9:11 AM 03/15/2021   10:00 AM 02/15/2021   10:13 AM 01/10/2021    9:43 AM  PHQ 2/9 Scores  PHQ - 2 Score 0 0 0 0 0 0 0  PHQ- 9 Score  0         Fall Risk    06/12/2023    9:43 AM 06/11/2023   11:36 AM 06/17/2022   10:52 AM 06/10/2022     9:00 AM 06/07/2021    9:10 AM  Fall Risk   Falls in the past year? 0 0 0 0 0  Number falls in past yr: 0  0 0 0  Injury with Fall? 0  0 0 0  Risk for fall due to : No Fall Risks  No Fall Risks No Fall Risks   Follow up Falls prevention discussed;Falls evaluation completed  Falls evaluation completed Falls evaluation completed Falls prevention discussed    MEDICARE RISK AT HOME: Medicare Risk at Home Any stairs in or around the home?: Yes If so, are there any without handrails?: No Home free of loose throw rugs in walkways, pet beds, electrical cords, etc?: Yes Adequate lighting in your home to reduce risk of falls?: Yes Life alert?: No Use of a cane, walker or w/c?: No Grab bars in the bathroom?: No Shower chair or bench in shower?: No Elevated toilet seat or a handicapped toilet?: No  TIMED UP AND GO:  Was the test performed?  No    Cognitive Function:        06/12/2023    9:44 AM 06/10/2022    9:08 AM  6CIT Screen  What Year? 0 points 0 points  What month? 0 points 0 points  What time? 0 points 0 points  Count back from 20 0 points 0 points  Months in reverse 0 points 0 points  Repeat phrase 0 points 2 points  Total Score 0 points 2 points    Immunizations Immunization History  Administered Date(s) Administered   Fluad Quad(high Dose 65+) 06/17/2022   Influenza Split 04/14/2013   Influenza, High Dose Seasonal PF 01/21/2019   Influenza, Quadrivalent, Recombinant, Inj, Pf 01/29/2017   Influenza,inj,Quad PF,6-35 Mos 01/26/2018   Influenza-Unspecified 01/29/2020, 02/19/2021   PFIZER(Purple Top)SARS-COV-2 Vaccination 05/20/2019, 06/09/2019, 01/29/2020   PNEUMOCOCCAL CONJUGATE-20 05/21/2021   Pfizer Covid Bivalent Pediatric Vaccine(37mos  to <20yrs) 02/19/2021   Pfizer(Comirnaty)Fall Seasonal Vaccine 12 years and older 06/17/2022   Tdap 10/13/2015    TDAP status: Up to date  Flu Vaccine status: Due, Education has been provided regarding the importance of this  vaccine. Advised may receive this vaccine at local pharmacy or Health Dept. Aware to provide a copy of the vaccination record if obtained from local pharmacy or Health Dept. Verbalized acceptance and understanding.  Pneumococcal vaccine status: Up to date  Covid-19 vaccine status: Declined, Education has been provided regarding the importance of this vaccine but patient still declined. Advised may receive this vaccine at local pharmacy or Health Dept.or vaccine clinic. Aware to provide a copy of the vaccination record if obtained from local pharmacy or Health Dept. Verbalized acceptance and understanding.  Qualifies for Shingles Vaccine? Yes   Zostavax completed No   Shingrix Completed?: No.    Education has been provided regarding the importance of this vaccine. Patient has been advised to call insurance company to determine out of pocket expense if they have not yet received this vaccine. Advised may also receive vaccine at local pharmacy or Health Dept. Verbalized acceptance and understanding.  Screening Tests Health Maintenance  Topic Date Due   Zoster Vaccines- Shingrix (1 of 2) Never done   INFLUENZA VACCINE  11/28/2022   COVID-19 Vaccine (6 - 2024-25 season) 12/29/2022   Colonoscopy  09/22/2023   Medicare Annual Wellness (AWV)  06/11/2024   MAMMOGRAM  06/30/2024   DTaP/Tdap/Td (2 - Td or Tdap) 10/12/2025   Pneumonia Vaccine 63+ Years old  Completed   DEXA SCAN  Completed   Hepatitis C Screening  Completed   HPV VACCINES  Aged Out    Health Maintenance  Health Maintenance Due  Topic Date Due   Zoster Vaccines- Shingrix (1 of 2) Never done   INFLUENZA VACCINE  11/28/2022   COVID-19 Vaccine (6 - 2024-25 season) 12/29/2022   Colonoscopy  09/22/2023    Colorectal cancer screening: Referral to GI placed 06/12/23. Pt aware the office will call re: appt.  Mammogram status: Ordered Scheduled for 07/10/23. Pt provided with contact info and advised to call to schedule appt.   Bone  Density status: Completed 06/29/20. Results reflect: Bone density results: OSTEOPENIA. Repeat every   years.     Additional Screening:  Hepatitis C Screening: does qualify; Completed 05/21/21  Vision Screening: Recommended annual ophthalmology exams for early detection of glaucoma and other disorders of the eye. Is the patient up to date with their annual eye exam?  Yes  Who is the provider or what is the name of the office in which the patient attends annual eye exams? [facility] If pt is not established with a provider, would they like to be referred to a provider to establish care? No .   Dental Screening: Recommended annual dental exams for proper oral hygiene    Community Resource Referral / Chronic Care Management:  CRR required this visit?  No   CCM required this visit?  No     Plan:     I have personally reviewed and noted the following in the patient's chart:   Medical and social history Use of alcohol, tobacco or illicit drugs  Current medications and supplements including opioid prescriptions. Patient is not currently taking opioid prescriptions. Functional ability and status Nutritional status Physical activity Advanced directives List of other physicians Hospitalizations, surgeries, and ER visits in previous 12 months Vitals Screenings to include cognitive, depression, and falls Referrals and appointments  In addition, I have reviewed and discussed with patient certain preventive protocols, quality metrics, and best practice recommendations. A written personalized care plan for preventive services as well as general preventive health recommendations were provided to patient.     Tillie Rung, LPN   08/05/8117   After Visit Summary: (MyChart) Due to this being a telephonic visit, the after visit summary with patients personalized plan was offered to patient via MyChart   Nurse Notes: None

## 2023-07-07 ENCOUNTER — Ambulatory Visit (HOSPITAL_BASED_OUTPATIENT_CLINIC_OR_DEPARTMENT_OTHER): Payer: Medicare PPO

## 2023-07-10 ENCOUNTER — Ambulatory Visit (HOSPITAL_BASED_OUTPATIENT_CLINIC_OR_DEPARTMENT_OTHER)
Admission: RE | Admit: 2023-07-10 | Discharge: 2023-07-10 | Disposition: A | Discharge status: Home / Self Care | Payer: Medicare PPO | Source: Ambulatory Visit | Attending: Family Medicine | Admitting: Family Medicine

## 2023-07-10 ENCOUNTER — Encounter (HOSPITAL_BASED_OUTPATIENT_CLINIC_OR_DEPARTMENT_OTHER): Payer: Self-pay

## 2023-07-10 DIAGNOSIS — Z1231 Encounter for screening mammogram for malignant neoplasm of breast: Secondary | ICD-10-CM | POA: Insufficient documentation

## 2023-08-12 ENCOUNTER — Telehealth: Payer: Self-pay | Admitting: Family Medicine

## 2023-08-12 NOTE — Telephone Encounter (Signed)
 Lvm to schedule. Please warm transfer to the office.

## 2023-08-12 NOTE — Telephone Encounter (Signed)
 Copied from CRM [id number]. Topic: General - Other >> Aug 12, 2023 10:56 AM Annelle Kiel wrote: Reason for CRM: patient is needing a appt for a physical she would like a call back the schedule is messed up in epic for dr copland

## 2023-08-19 ENCOUNTER — Ambulatory Visit: Payer: Medicare PPO

## 2023-08-24 ENCOUNTER — Other Ambulatory Visit: Payer: Self-pay | Admitting: Family Medicine

## 2023-08-24 DIAGNOSIS — F3342 Major depressive disorder, recurrent, in full remission: Secondary | ICD-10-CM

## 2023-08-24 DIAGNOSIS — I4891 Unspecified atrial fibrillation: Secondary | ICD-10-CM

## 2023-09-13 IMAGING — MG MM DIGITAL SCREENING BILAT W/ TOMO AND CAD
8 series · 8 of 24 positions shown · non-contrast
Comparison: Previous exam(s).

ACR Breast Density Category a: The breast tissue is almost entirely
fatty.

CLINICAL DATA: Screening.

EXAM:
DIGITAL SCREENING BILATERAL MAMMOGRAM WITH TOMOSYNTHESIS AND CAD
TECHNIQUE: Bilateral screening digital craniocaudal and mediolateral oblique
mammograms were obtained. Bilateral screening digital breast
tomosynthesis was performed. The images were evaluated with
computer-aided detection.

[R MLO synth-2D]
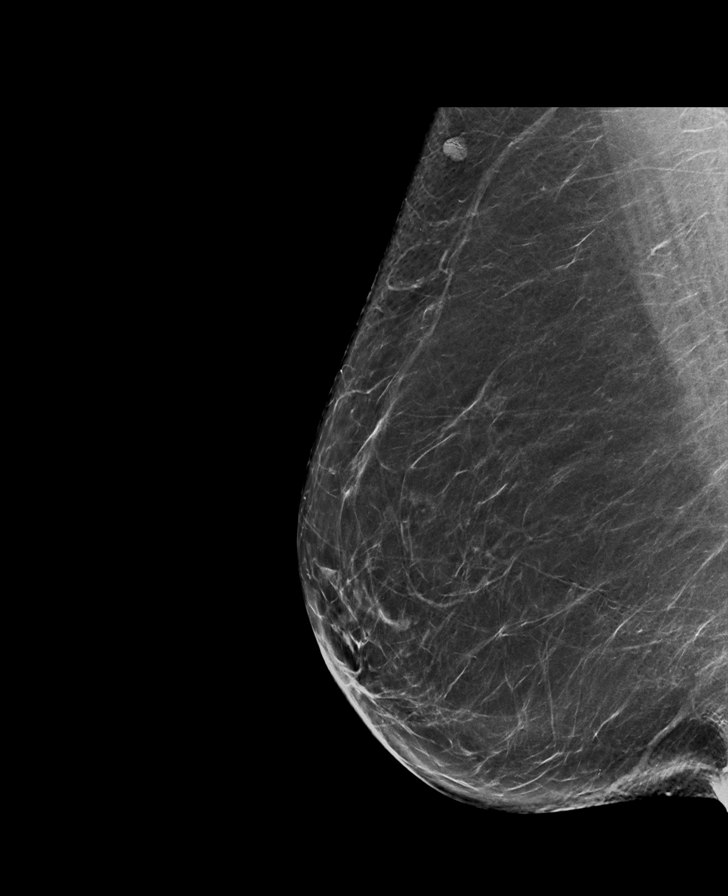

[L MLO synth-2D]
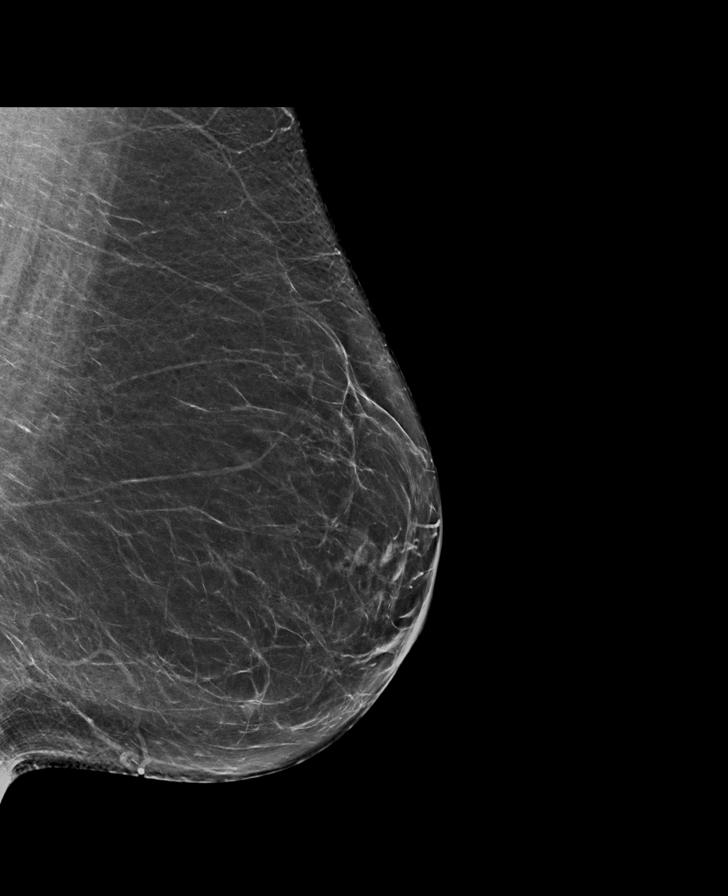

[L CC synth-2D]
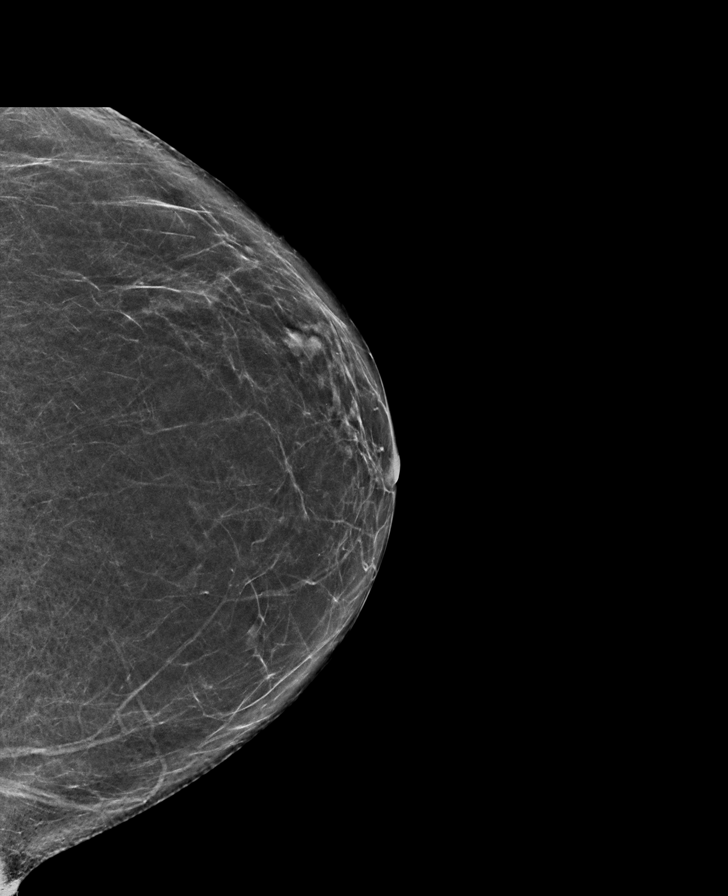

[R CC synth-2D]
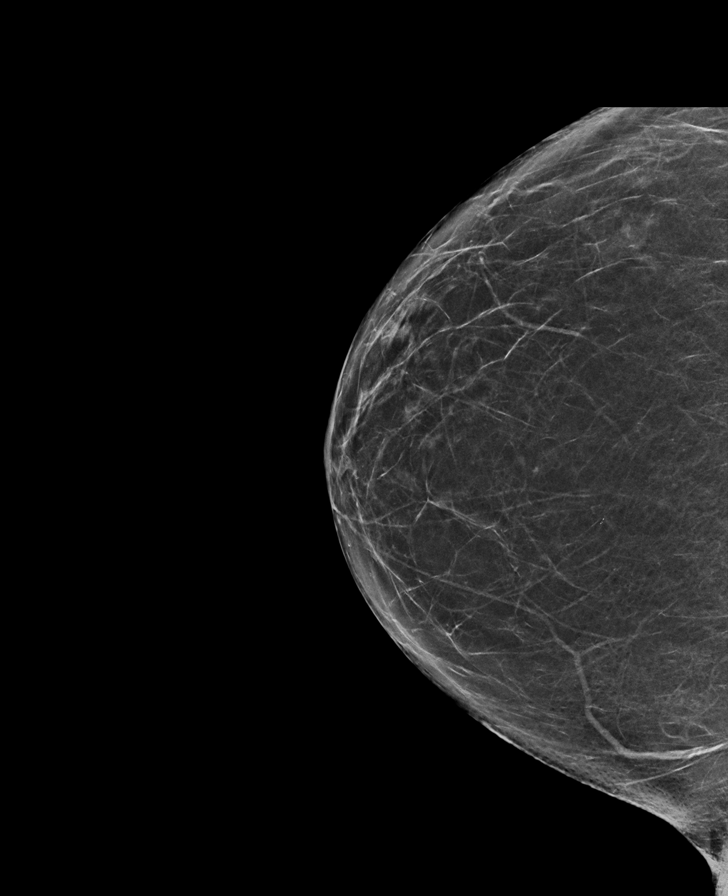

[R MLO tomo · tomo slice 38/75.0]
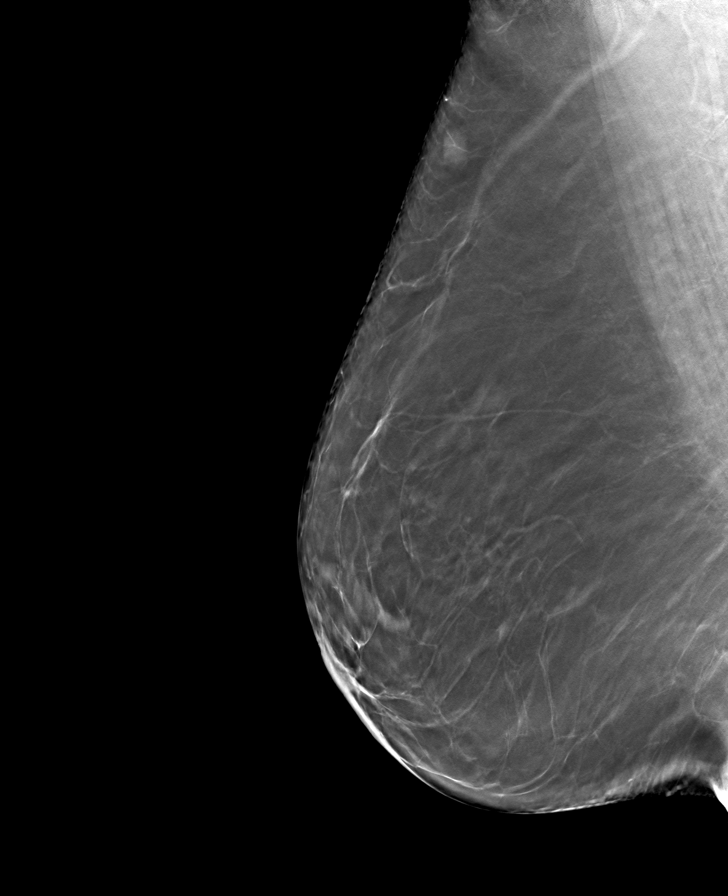

[R CC tomo · tomo slice 32/63.0]
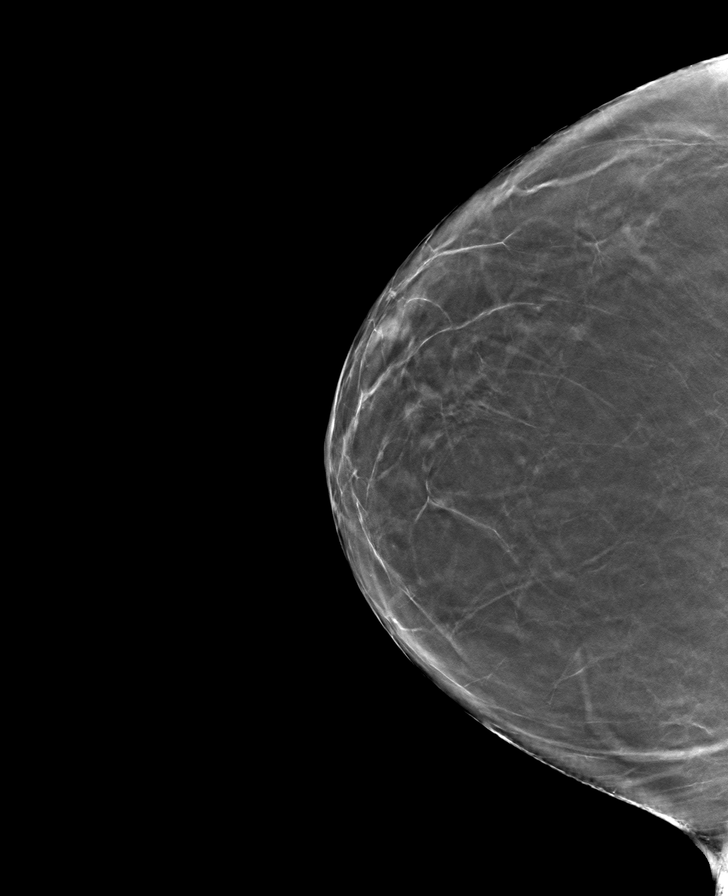

[L CC tomo · tomo slice 32/63.0]
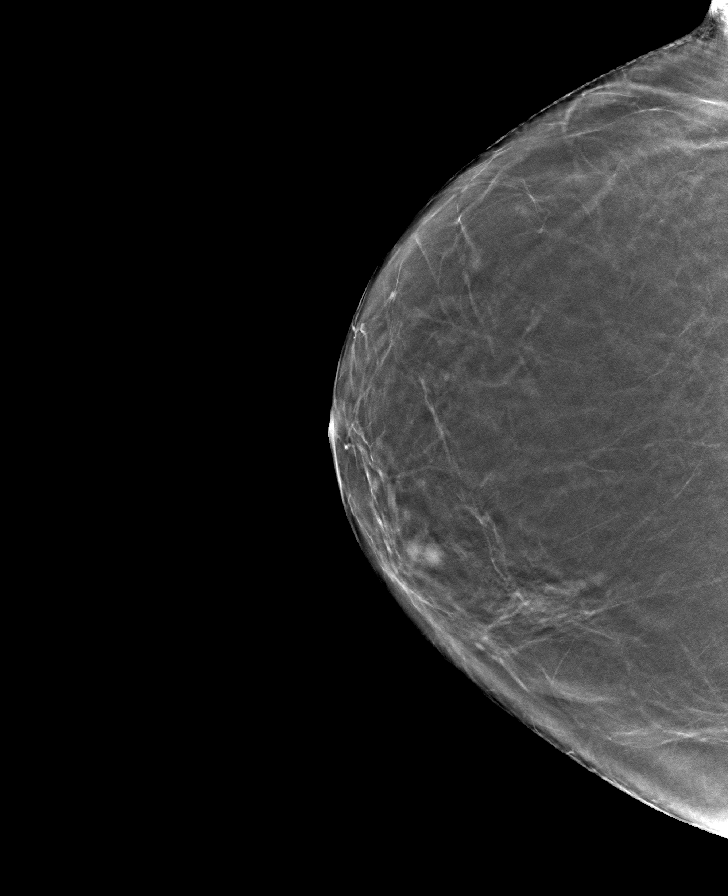

[L MLO tomo · tomo slice 35/70.0]
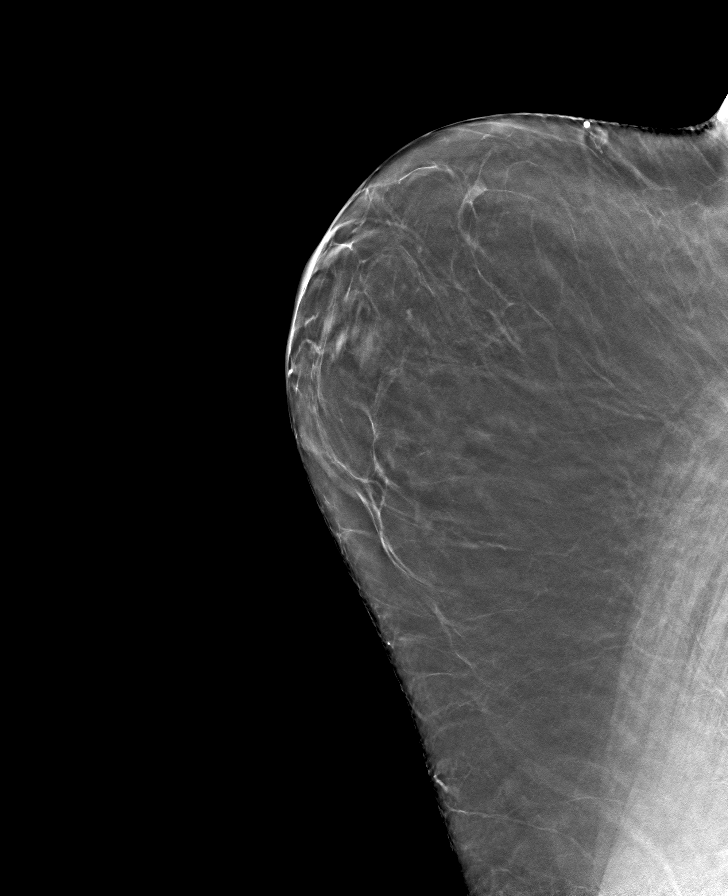

[8 of 24 positions shown; findings below may reference images not displayed]

FINDINGS: There are no findings suspicious for malignancy.
IMPRESSION: No mammographic evidence of malignancy. A result letter of this
screening mammogram will be mailed directly to the patient.

RECOMMENDATION:
Screening mammogram in one year. (Code:0E-3-N98)

BI-RADS CATEGORY  1: Negative.

## 2023-09-13 NOTE — Progress Notes (Deleted)
  Healthcare at [facility] [address] [place], [place] [address] [phone]  Date:  09/17/2023   Name:  Beth Arroyo   DOB:  Sep 24, 1955   MRN:  [id number]  PCP:  Kaylee Partridge, MD    Chief Complaint: No chief complaint on file.   History of Present Illness:  Beth Arroyo is a 69 y.o. very pleasant female patient who presents with the following:  Pt seen today for a CPE- History of paroxysmal atrial fib, depression/grief, hypertension, osteopenia, hyperlipidemia  Last seen by myself in February 2024  She lost her son Beth Arroyo in [birth year] at age 4 She is a retired Tourist information centre manager  Seen by cardiology in March 2024: Primary prevention stressed with the patient.  Importance of compliance with diet medication stressed and patient verbalized standing. Paroxysmal atrial fibrillation:I discussed with the patient atrial fibrillation, disease process. Management and therapy including rate and rhythm control, anticoagulation benefits and potential risks were discussed extensively with the patient. Patient had multiple questions which were answered to patient's satisfaction. Essential hypertension: Blood pressure stable and diet was emphasized. Mixed dyslipidemia: Lipid lowering medications followed by primary care. Obesity: Weight reduction was stressed and diet was emphasized and she promises to do better. Patient will be seen in follow-up appointment in 12 months or earlier if the patient has any concerns.  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   Tenosynovitis of right hand 09/21/2020   Osteopenia 06/30/2020   Arrhythmia 05/24/2020   Depression    Essential  hypertension 08/30/2019   Overweight 08/30/2019   Paroxysmal atrial fibrillation (HCC) 07/22/2019   Hyperlipidemia 07/22/2019   Obesity (BMI 35.0-39.9 without comorbidity) 07/22/2019   Grief at loss of child 07/22/2019   GERD (gastroesophageal reflux disease) 10/13/2015   Recurrent major depressive disorder, in full remission (HCC) 10/13/2015   Annual physical exam 10/13/2015   History of adenomatous polyp of colon 10/13/2015    Past Medical History:  Diagnosis Date   Annual physical exam 10/13/2015   Arrhythmia 05/24/2020   Depression    Essential hypertension    GERD (gastroesophageal reflux disease) 10/13/2015   Grief at loss of child 07/22/2019   History of adenomatous polyp of colon 10/13/2015   Hyperlipidemia    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0   Smokeless tobacco: Never  Substance Use Topics   Alcohol use: Yes    Comment: rare   Drug use: Never    Family History  Problem Relation Age of Onset   Pulmonary disease  Mother    Diabetes Mother    Heart disease Father    Thyroid  cancer Sister    Kidney cancer Sister     No Known Allergies  Medication list has been reviewed and updated.  Current Outpatient Medications on File Prior to Visit  Medication Sig Dispense Refill   buPROPion  (WELLBUTRIN ) 75 MG tablet TAKE 1 TABLET (75 MG TOTAL) BY MOUTH DAILY.  APPT FOR REFILLS 90 tablet 0   Cholecalciferol (D3 2000) 50 MCG (2000 UT) CAPS Take 2,000 Units by mouth daily.     Cholecalciferol (VITAMIN D3) 1.25 MG (50000 UT) CAPS Take 1 capsule by mouth once a week. (Patient not taking: Reported on 07/18/2022) 4 capsule 0   ELIQUIS  5 MG TABS tablet TAKE 1 TABLET (5 MG TOTAL) BY MOUTH 2 (TWO) TIMES DAILY. APPT FOR REFILLS 180 tablet 0   metoprolol  succinate (TOPROL -XL) 50 MG 24 hr tablet TAKE 1 TABLET BY MOUTH EVERY DAY 90 tablet 3   omeprazole (PRILOSEC) 20 MG capsule Take 20 mg by mouth daily.     sertraline  (ZOLOFT ) 50 MG tablet TAKE 1 TABLET BY MOUTH EVERY DAY 90 tablet 3   simvastatin  (ZOCOR ) 10 MG tablet TAKE 1 TABLET (10 MG TOTAL) BY MOUTH DAILY AT 6 PM. 90 tablet 3   No current facility-administered medications on file prior to visit.    Review of Systems:  As per HPI- otherwise negative.   Physical Examination: There were no vitals filed for this visit. There were no vitals filed for this visit. There is no height or weight on file to calculate BMI. Ideal Body Weight:    GEN: no acute distress. HEENT: Atraumatic, Normocephalic.  Ears and Nose: No external deformity. CV: RRR, No M/G/R. No JVD. No thrill. No extra heart sounds. PULM: CTA B, no wheezes, crackles, rhonchi. No retractions. No resp. distress. No accessory muscle use. ABD: S, NT, ND, +BS. No rebound. No HSM. EXTR: No c/c/e PSYCH: Normally interactive. Conversant.    Assessment and Plan: *** Physical exam today- encouraged healthy diet and exercise routine Will plan further follow- up pending labs.  Signed Gates Kasal, MD

## 2023-09-13 NOTE — Patient Instructions (Signed)
 Good to see you today Recommend shingles vaccine series at your pharmacy if not done already

## 2023-09-17 ENCOUNTER — Ambulatory Visit (INDEPENDENT_AMBULATORY_CARE_PROVIDER_SITE_OTHER): Admitting: Family Medicine

## 2023-09-17 DIAGNOSIS — Z131 Encounter for screening for diabetes mellitus: Secondary | ICD-10-CM

## 2023-09-17 DIAGNOSIS — E559 Vitamin D deficiency, unspecified: Secondary | ICD-10-CM

## 2023-09-17 DIAGNOSIS — Z130 Encounter for screening for diseases of the blood and blood-forming organs and certain disorders involving the immune mechanism: Secondary | ICD-10-CM

## 2023-09-17 DIAGNOSIS — Z0000 Encounter for general adult medical examination without abnormal findings: Secondary | ICD-10-CM

## 2023-09-17 DIAGNOSIS — Z1211 Encounter for screening for malignant neoplasm of colon: Secondary | ICD-10-CM

## 2023-09-17 DIAGNOSIS — Z1329 Encounter for screening for other suspected endocrine disorder: Secondary | ICD-10-CM

## 2023-09-17 DIAGNOSIS — Z91199 Patient's noncompliance with other medical treatment and regimen due to unspecified reason: Secondary | ICD-10-CM

## 2023-09-17 DIAGNOSIS — E782 Mixed hyperlipidemia: Secondary | ICD-10-CM

## 2023-09-18 NOTE — Progress Notes (Unsigned)
 [facility] at [facility] [address] [place], [place] [address] [phone]  Date:  09/17/2023   Name:  Beth Arroyo   DOB:  [birth year]   MRN:  [id number]  PCP:  Kaylee Partridge, MD    Chief Complaint: No chief complaint on file.   History of Present Illness:  Beth Arroyo is a 68 y.o. very pleasant female patient who presents with the following:  Pt seen today for a CPE- History of paroxysmal atrial fib, depression/grief, hypertension, osteopenia, hyperlipidemia.  Mild prediabetes Last seen by myself in February 2024  She lost her son [name] in 44 at age 28 She is a retired [employer] information [employer] manager  Seen by cardiology in March 2024: Primary prevention stressed with the patient.  Importance of compliance with diet medication stressed and patient verbalized standing. Paroxysmal atrial fibrillation:I discussed with the patient atrial fibrillation, disease process. Management and therapy including rate and rhythm control, anticoagulation benefits and potential risks were discussed extensively with the patient. Patient had multiple questions which were answered to patient's satisfaction. Essential hypertension: Blood pressure stable and diet was emphasized. Mixed dyslipidemia: Lipid lowering medications followed by primary care. Obesity: Weight reduction was stressed and diet was emphasized and she promises to do better. Patient will be seen in follow-up appointment in 12 months or earlier if the patient has any concerns.  Bupropion  Eliquis  5 twice daily Toprol -XL 50 Sertraline  Simvastatin  She is taking OTC vitamin D  daily   Colon due this year - pt reports she will call in a few days and set this up Mammo UTD Dexa can be updated - will order for her today  She has stopped pap  Labs one year ago - update today  Recommend shingrix  She is overdue for cardiology follow-up, I sent a message to her cardiology team and  asked them to please reach out.  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   Tenosynovitis of right hand 09/21/2020   Osteopenia 06/30/2020   Arrhythmia 05/24/2020   Depression    Essential hypertension 08/30/2019   Overweight 08/30/2019   Paroxysmal atrial fibrillation (HCC) 07/22/2019   Hyperlipidemia 07/22/2019   Obesity (BMI 35.0-39.9 without comorbidity) 07/22/2019   Grief at loss of child 07/22/2019   GERD (gastroesophageal reflux disease) 10/13/2015   Recurrent major depressive disorder, in full remission (HCC) 10/13/2015   Annual physical exam 10/13/2015   History of adenomatous polyp of colon 10/13/2015    Past Medical History:  Diagnosis Date   Annual physical exam 10/13/2015   Arrhythmia 05/24/2020   Depression    Essential hypertension    GERD (gastroesophageal reflux disease) 10/13/2015   Grief at loss of child 07/22/2019   History of adenomatous polyp of colon 10/13/2015   Hyperlipidemia    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   Obesity (BMI 35.0-39.9 without comorbidity) 07/22/2019   Osteopenia 06/30/2020   Overweight 08/30/2019   Paroxysmal atrial fibrillation (HCC) 07/22/2019   Recurrent major depressive disorder, in full remission (HCC) 10/13/2015   Tenosynovitis of right hand 09/21/2020    Past Surgical History:  Procedure Laterality Date  CESAREAN SECTION  1988    Social History   Tobacco Use   Smoking status: Former    Current packs/day: 0.00    Average packs/day: 0.3 packs/day for 2.0 years (0.5 ttl pk-yrs)    Types: Cigarettes    Start date: 08/30/2006    Quit date: 08/29/2008    Years since quitting: 15.0   Smokeless  tobacco: Never  Substance Use Topics   Alcohol use: Yes    Comment: rare   Drug use: Never    Family History  Problem Relation Age of Onset   Pulmonary disease Mother    Diabetes Mother    Heart disease Father    Thyroid  cancer Sister    Kidney cancer Sister     No Known Allergies  Medication list has been reviewed and updated.  Current Outpatient Medications on File Prior to Visit  Medication Sig Dispense Refill   buPROPion  (WELLBUTRIN ) 75 MG tablet TAKE 1 TABLET (75 MG TOTAL) BY MOUTH DAILY. APPT FOR REFILLS 90 tablet 0   Cholecalciferol (D3 2000) 50 MCG (2000 UT) CAPS Take 2,000 Units by mouth daily.     Cholecalciferol (VITAMIN D3) 1.25 MG (50000 UT) CAPS Take 1 capsule by mouth once a week. (Patient not taking: Reported on 07/18/2022) 4 capsule 0   ELIQUIS  5 MG TABS tablet TAKE 1 TABLET (5 MG TOTAL) BY MOUTH 2 (TWO) TIMES DAILY. APPT FOR REFILLS 180 tablet 0   metoprolol  succinate (TOPROL -XL) 50 MG 24 hr tablet TAKE 1 TABLET BY MOUTH EVERY DAY 90 tablet 3   omeprazole (PRILOSEC) 20 MG capsule Take 20 mg by mouth daily.     sertraline  (ZOLOFT ) 50 MG tablet TAKE 1 TABLET BY MOUTH EVERY DAY 90 tablet 3   simvastatin  (ZOCOR ) 10 MG tablet TAKE 1 TABLET (10 MG TOTAL) BY MOUTH DAILY AT 6 PM. 90 tablet 3   No current facility-administered medications on file prior to visit.    Review of Systems:  As per HPI- otherwise negative.   Physical Examination: There were no vitals filed for this visit. There were no vitals filed for this visit. There is no height or weight on file to calculate BMI. Ideal Body Weight:   Blood pressure 128/84, pulse 114, temperature 98, sats at 98%  GEN: no acute distress.  Obese, looks well HEENT: Atraumatic, Normocephalic.  Bilateral TM wnl, oropharynx normal.  PEERL,EOMI. both ear canals appear normal Ears and Nose: No external deformity. CV: atrialfib , rate controlled  No M/G/R. No JVD. No thrill. No extra heart sounds. PULM: CTA B, no  wheezes, crackles, rhonchi. No retractions. No resp. distress. No accessory muscle use. ABD: S, NT, ND, +BS. No rebound. No HSM. EXTR: No c/c/e PSYCH: Normally interactive. Conversant.    Assessment and Plan: Physical exam  Recurrent major depressive disorder, in full remission (HCC) - Plan: buPROPion  (WELLBUTRIN ) 75 MG tablet  Atrial fibrillation, unspecified type (HCC) - Plan: apixaban  (ELIQUIS ) 5 MG TABS tablet, CBC, TSH  Mixed hyperlipidemia - Plan: Lipid panel  Vitamin D  deficiency - Plan: VITAMIN D  25 Hydroxy (Vit-D Deficiency, Fractures)  Screening for diabetes mellitus - Plan: Comprehensive metabolic panel with GFR, Hemoglobin A1c  Tinnitus, unspecified laterality - Plan: Ambulatory referral to ENT  Estrogen deficiency - Plan: DG Bone Density  Physical exam today- encouraged healthy diet and exercise routine Will plan further follow- up pending labs. She is in atrial for today, patient does not notice any symptoms of same.  She is taking her anticoagulants as well  as beta-blocker.  I asked her to continue these medications, will have her follow-up with cardiology  Depression symptoms are under good control with Wellbutrin  and sertraline , continue both Referral to ENT to discuss her tinnitus symptoms and difficulty hearing    Signed Gates Kasal, MD  Results for orders placed or performed in visit on 09/24/23  CBC   Collection Time: 09/24/23 10:26 AM  Result Value Ref Range   WBC 9.2 4.0 - 10.5 K/uL   RBC 4.99 3.87 - 5.11 Mil/uL   Platelets 319.0 150.0 - 400.0 K/uL   Hemoglobin 15.2 (H) 12.0 - 15.0 g/dL   HCT 27.2 53.6 - 64.4 %   MCV 91.5 78.0 - 100.0 fl   MCHC 33.3 30.0 - 36.0 g/dL   RDW 03.4 74.2 - 59.5 %  Comprehensive metabolic panel with GFR   Collection Time: 09/24/23 10:26 AM  Result Value Ref Range   Sodium 139 135 - 145 mEq/L   Potassium 4.2 3.5 - 5.1 mEq/L   Chloride 103 96 - 112 mEq/L   CO2 27 19 - 32 mEq/L   Glucose, Bld 100 (H) 70 - 99  mg/dL   BUN 12 6 - 23 mg/dL   Creatinine, Ser 6.38 0.40 - 1.20 mg/dL   Total Bilirubin 0.7 0.2 - 1.2 mg/dL   Alkaline Phosphatase 49 39 - 117 U/L   AST 19 0 - 37 U/L   ALT 17 0 - 35 U/L   Total Protein 6.7 6.0 - 8.3 g/dL   Albumin 4.4 3.5 - 5.2 g/dL   GFR 75.64 >33.29 mL/min   Calcium 9.5 8.4 - 10.5 mg/dL  Hemoglobin J1O   Collection Time: 09/24/23 10:26 AM  Result Value Ref Range   Hgb A1c MFr Bld 5.8 4.6 - 6.5 %  Lipid panel   Collection Time: 09/24/23 10:26 AM  Result Value Ref Range   Cholesterol 185 0 - 200 mg/dL   Triglycerides 841.6 0.0 - 149.0 mg/dL   HDL 60.63 >01.60 mg/dL   VLDL 10.9 0.0 - 32.3 mg/dL   LDL Cholesterol 557 (H) 0 - 99 mg/dL   Total CHOL/HDL Ratio 4    NonHDL 132.37   TSH   Collection Time: 09/24/23 10:26 AM  Result Value Ref Range   TSH 2.44 0.35 - 5.50 uIU/mL  VITAMIN D  25 Hydroxy (Vit-D Deficiency, Fractures)   Collection Time: 09/24/23 10:26 AM  Result Value Ref Range   VITD 28.84 (L) 30.00 - 100.00 ng/mL

## 2023-09-18 NOTE — Progress Notes (Signed)
 canceled

## 2023-09-24 ENCOUNTER — Ambulatory Visit (INDEPENDENT_AMBULATORY_CARE_PROVIDER_SITE_OTHER): Admitting: Family Medicine

## 2023-09-24 ENCOUNTER — Encounter: Payer: Self-pay | Admitting: Family Medicine

## 2023-09-24 VITALS — BP 124/80 | HR 114 | Temp 98.0°F | Ht 61.6 in | Wt 193.0 lb

## 2023-09-24 DIAGNOSIS — E2839 Other primary ovarian failure: Secondary | ICD-10-CM

## 2023-09-24 DIAGNOSIS — R7303 Prediabetes: Secondary | ICD-10-CM | POA: Diagnosis not present

## 2023-09-24 DIAGNOSIS — E782 Mixed hyperlipidemia: Secondary | ICD-10-CM | POA: Diagnosis not present

## 2023-09-24 DIAGNOSIS — E559 Vitamin D deficiency, unspecified: Secondary | ICD-10-CM | POA: Diagnosis not present

## 2023-09-24 DIAGNOSIS — F3342 Major depressive disorder, recurrent, in full remission: Secondary | ICD-10-CM

## 2023-09-24 DIAGNOSIS — I4891 Unspecified atrial fibrillation: Secondary | ICD-10-CM

## 2023-09-24 DIAGNOSIS — H9319 Tinnitus, unspecified ear: Secondary | ICD-10-CM | POA: Diagnosis not present

## 2023-09-24 DIAGNOSIS — Z0000 Encounter for general adult medical examination without abnormal findings: Secondary | ICD-10-CM

## 2023-09-24 DIAGNOSIS — Z131 Encounter for screening for diabetes mellitus: Secondary | ICD-10-CM | POA: Diagnosis not present

## 2023-09-24 LAB — COMPREHENSIVE METABOLIC PANEL WITH GFR
ALT: 17 U/L (ref 0–35)
AST: 19 U/L (ref 0–37)
Albumin: 4.4 g/dL (ref 3.5–5.2)
Alkaline Phosphatase: 49 U/L (ref 39–117)
BUN: 12 mg/dL (ref 6–23)
CO2: 27 meq/L (ref 19–32)
Calcium: 9.5 mg/dL (ref 8.4–10.5)
Chloride: 103 meq/L (ref 96–112)
Creatinine, Ser: 0.67 mg/dL (ref 0.40–1.20)
GFR: 88.86 mL/min (ref >60.00)
Glucose, Bld: 100 mg/dL — ABNORMAL HIGH (ref 70–99)
Potassium: 4.2 meq/L (ref 3.5–5.1)
Sodium: 139 meq/L (ref 135–145)
Total Bilirubin: 0.7 mg/dL (ref 0.2–1.2)
Total Protein: 6.7 g/dL (ref 6.0–8.3)

## 2023-09-24 LAB — TSH: TSH: 2.44 u[IU]/mL (ref 0.35–5.50)

## 2023-09-24 LAB — LIPID PANEL
Cholesterol: 185 mg/dL (ref 0–200)
HDL: 52.7 mg/dL (ref >39.00)
LDL Cholesterol: 110 mg/dL — ABNORMAL HIGH (ref 0–99)
NonHDL: 132.37
Total CHOL/HDL Ratio: 4
Triglycerides: 114 mg/dL (ref 0.0–149.0)
VLDL: 22.8 mg/dL (ref 0.0–40.0)

## 2023-09-24 LAB — CBC
HCT: 45.6 % (ref 36.0–46.0)
Hemoglobin: 15.2 g/dL — ABNORMAL HIGH (ref 12.0–15.0)
MCHC: 33.3 g/dL (ref 30.0–36.0)
MCV: 91.5 fl (ref 78.0–100.0)
Platelets: 319 10*3/uL (ref 150.0–400.0)
RBC: 4.99 Mil/uL (ref 3.87–5.11)
RDW: 13.8 % (ref 11.5–15.5)
WBC: 9.2 10*3/uL (ref 4.0–10.5)

## 2023-09-24 LAB — VITAMIN D 25 HYDROXY (VIT D DEFICIENCY, FRACTURES): VITD: 28.84 ng/mL — ABNORMAL LOW (ref 30.00–100.00)

## 2023-09-24 LAB — HEMOGLOBIN A1C: Hgb A1c MFr Bld: 5.8 % (ref 4.6–6.5)

## 2023-09-24 MED ORDER — BUPROPION HCL 75 MG PO TABS: 75.0000 mg | ORAL_TABLET | Freq: Every day | ORAL | 3 refills | Status: DC

## 2023-09-24 MED ORDER — APIXABAN 5 MG PO TABS: 5.0000 mg | ORAL_TABLET | Freq: Two times a day (BID) | ORAL | 3 refills | Status: DC

## 2023-09-24 NOTE — Patient Instructions (Signed)
 Good to see you today!  Please schedule your bone density at your convenience Please get the shingles vaccine series at your pharmacy when convenient I will be in touch with your labs  You might check if your insurance will cover a GLP-1 drug for weight loss.  If they do not cover you might use the "[employer]" pharmacy program to get cheaper from the manufacturer

## 2023-10-08 ENCOUNTER — Encounter: Payer: Self-pay | Admitting: Cardiology

## 2023-10-08 ENCOUNTER — Ambulatory Visit: Attending: Cardiology | Admitting: Cardiology

## 2023-10-08 VITALS — BP 128/74 | HR 52 | Ht 61.0 in | Wt 192.0 lb

## 2023-10-08 DIAGNOSIS — E669 Obesity, unspecified: Secondary | ICD-10-CM

## 2023-10-08 DIAGNOSIS — E782 Mixed hyperlipidemia: Secondary | ICD-10-CM | POA: Diagnosis not present

## 2023-10-08 DIAGNOSIS — I480 Paroxysmal atrial fibrillation: Secondary | ICD-10-CM

## 2023-10-08 DIAGNOSIS — I10 Essential (primary) hypertension: Secondary | ICD-10-CM | POA: Diagnosis not present

## 2023-10-08 NOTE — Patient Instructions (Signed)
 Medication Instructions:  Your physician recommends that you continue on your current medications as directed. Please refer to the Current Medication list given to you today.  *If you need a refill on your cardiac medications before your next appointment, please call your pharmacy*  Lab Work: None ordered.  You may go to any [facility] Location for your lab work:  [facility] - [address] ([facility]) - [address] [phone] [address]  [place] - [address]   [place]  - [address]   [place] - [address] - [address] [employer]  - [address] [place] [address] ([facility]   If you have labs (blood work) drawn today and your tests are completely normal, you will receive your results only by: [employer] (if you have MyChart)  If you have any lab test that is abnormal or we need to change your treatment, we will call you or send a MyChart message to review the results.  Testing/Procedures: None ordered.  Follow-Up: At [facility], you and your health needs are our priority.  As part of our continuing mission to provide you with exceptional heart care, we have created designated Provider Care Teams.  These Care Teams include your primary Cardiologist (physician) and Advanced Practice Providers (APPs -  Physician Assistants and Nurse Practitioners) who all work together to provide you with the care you need, when you need it.  We recommend signing up for the patient portal called MyChart.  Sign up information is provided on this After Visit Summary.  MyChart is used to connect with patients for Virtual Visits (Telemedicine).  Patients are able to view lab/test results, encounter notes, upcoming appointments, etc.  Non-urgent messages can be sent to your provider as well.   To learn more about what you can do with MyChart, go to  [email or web address].    Your next appointment:   9 months  The format for your next appointment:   In Person  Provider:   Hillis Lu, MD

## 2023-10-08 NOTE — Progress Notes (Signed)
 Cardiology Office Note:    Date:  10/08/2023   ID:  Beth Arroyo, DOB 05-May-1955, MRN [id number]  PCP:  Beth Partridge, MD  Cardiologist:  Beth Balzarine, MD   Referring MD: Beth Partridge, MD    ASSESSMENT:    1. Paroxysmal atrial fibrillation (HCC)   2. Essential hypertension   3. Mixed hyperlipidemia   4. Obesity (BMI 35.0-39.9 without comorbidity)    PLAN:    In order of problems listed above:  Primary prevention stressed with the patient.  Importance of compliance with diet medication stressed and patient verbalized standing. Paroxysmal atrial fibrillation:I discussed with the patient atrial fibrillation, disease process. Management and therapy including rate and rhythm control, anticoagulation benefits and potential risks were discussed extensively with the patient. Patient had multiple questions which were answered to patient's satisfaction. Mixed dyslipidemia: On lipid-lowering medications followed by primary care. Obesity: Weight reduction stressed diet emphasized.  She promises to do better.  Risks of obesity explained. Patient will be seen in follow-up appointment in 9 months or earlier if the patient has any concerns.    Medication Adjustments/Labs and Tests Ordered: Current medicines are reviewed at length with the patient today.  Concerns regarding medicines are outlined above.  Orders Placed This Encounter  Procedures   EKG 12-Lead   No orders of the defined types were placed in this encounter.    No chief complaint on file.    History of Present Illness:    Beth Arroyo is a 68 y.o. female.  Patient has past medical history of essential hypertension, paroxysmal atrial fibrillation, mixed dyslipidemia and obesity.  She denies any problems at this time and takes care of activities of daily living.  No chest pain orthopnea or PND.  At the time of my evaluation, the patient is alert awake oriented and in no distress.  She walks 30 minutes a  day on a daily basis.  With this she has no symptoms.  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   buPROPion  (WELLBUTRIN ) 75 MG tablet Take 1 tablet (75 mg total) by mouth daily. Appt for refills   Cholecalciferol (D3 2000) 50 MCG (2000 UT) CAPS Take 2,000 Units by mouth daily.   metoprolol  succinate (TOPROL -XL) 50 MG 24 hr tablet TAKE 1 TABLET BY MOUTH EVERY DAY   omeprazole (PRILOSEC) 20 MG capsule Take 20 mg by mouth daily.   sertraline  (ZOLOFT ) 50 MG tablet TAKE 1 TABLET BY MOUTH EVERY DAY   simvastatin  (ZOCOR ) 10 MG tablet TAKE 1 TABLET (10 MG TOTAL) BY MOUTH DAILY AT 6 PM.     Allergies:   Patient has no known allergies.   Social History   Socioeconomic  History   Marital status: Divorced    Spouse name: Not on file   Number of children: Not on file   Years of education: Not on file   Highest education level: Bachelor's degree (e.g., BA, AB, BS)  Occupational History   Not on file  Tobacco Use   Smoking status: Former    Current packs/day: 0.00    Average packs/day: 0.3 packs/day for 2.0 years (0.5 ttl pk-yrs)    Types: Cigarettes    Start date: 08/30/2006    Quit date: 08/29/2008     Years since quitting: 15.1   Smokeless tobacco: Never  Substance and Sexual Activity   Alcohol use: Yes    Comment: rare   Drug use: Never   Sexual activity: Not on file  Other Topics Concern   Not on file  Social History Narrative   Not on file   Social Drivers of Health   Financial Resource Strain: Low Risk  (06/12/2023)   Overall Financial Resource Strain (CARDIA)    Difficulty of Paying Living Expenses: Not hard at all  Food Insecurity: No Food Insecurity (06/12/2023)   Hunger Vital Sign    Worried About Running Out of Food in the Last Year: Never true    Ran Out of Food in the Last Year: Never true  Transportation Needs: No Transportation Needs (06/12/2023)   PRAPARE - Administrator, Civil Service (Medical): No    Lack of Transportation (Non-Medical): No  Physical Activity: Insufficiently Active (06/12/2023)   Exercise Vital Sign    Days of Exercise per Week: 4 days    Minutes of Exercise per Session: 30 min  Stress: No Stress Concern Present (06/12/2023)   [employer] of Occupational Health - Occupational Stress Questionnaire    Feeling of Stress : Not at all  Social Connections: Unknown (06/12/2023)   Social Connection and Isolation Panel [NHANES]    Frequency of Communication with Friends and Family: More than three times a week    Frequency of Social Gatherings with Friends and Family: More than three times a week    Attends Religious Services: Patient declined    Database administrator or Organizations: Yes    Attends Banker Meetings: 1 to 4 times per year    Marital Status: Divorced     Family History: The patient's family history includes Diabetes in her mother; Heart disease in her father; Kidney cancer in her sister; Pulmonary disease in her mother; Thyroid  cancer in her sister.  ROS:   Please see the history of present illness.    All other systems reviewed and are negative.  EKGs/Labs/Other Studies Reviewed:    The  following studies were reviewed today: I discussed my findings with the patient at length   Recent Labs: 09/24/2023: ALT 17; BUN 12; Creatinine, Ser 0.67; Hemoglobin 15.2; Platelets 319.0; Potassium 4.2; Sodium 139; TSH 2.44  Recent Lipid Panel    Component Value Date/Time   CHOL 185 09/24/2023 1026   CHOL 179 05/26/2020 1119   TRIG 114.0 09/24/2023 1026   HDL 52.70 09/24/2023 1026   HDL 52 05/26/2020 1119   CHOLHDL 4 09/24/2023 1026   VLDL 22.8 09/24/2023 1026   LDLCALC 110 (H) 09/24/2023 1026   LDLCALC 112 (H) 05/26/2020 1119    Physical Exam:    VS:  Ht 5' 1 (1.549 m)   Wt 192 lb (87.1 kg)   BMI 36.28 kg/m     Wt Readings from Last 3 Encounters:  10/08/23 192 lb (87.1 kg)  09/24/23 193 lb (87.5 kg)  06/12/23 192 lb (87.1 kg)     GEN: Patient is in no acute distress HEENT: Normal NECK: No JVD; No carotid bruits LYMPHATICS: No lymphadenopathy CARDIAC: Hear sounds regular, 2/6 systolic murmur at the apex. RESPIRATORY:  Clear to auscultation without rales, wheezing or rhonchi  ABDOMEN: Soft, non-tender, non-distended MUSCULOSKELETAL:  No edema; No deformity  SKIN: Warm and dry NEUROLOGIC:  Alert and oriented x 3 PSYCHIATRIC:  Normal affect   Signed, Beth Balzarine, MD  10/08/2023 11:30 AM    [facility]

## 2023-12-24 ENCOUNTER — Encounter: Payer: Self-pay | Admitting: Family Medicine

## 2023-12-24 DIAGNOSIS — Z1211 Encounter for screening for malignant neoplasm of colon: Secondary | ICD-10-CM

## 2024-01-01 ENCOUNTER — Ambulatory Visit (HOSPITAL_BASED_OUTPATIENT_CLINIC_OR_DEPARTMENT_OTHER)
Admission: RE | Admit: 2024-01-01 | Discharge: 2024-01-01 | Disposition: A | Discharge status: Home / Self Care | Source: Ambulatory Visit | Attending: Family Medicine | Admitting: Family Medicine

## 2024-01-01 DIAGNOSIS — M85851 Other specified disorders of bone density and structure, right thigh: Secondary | ICD-10-CM | POA: Diagnosis not present

## 2024-01-01 DIAGNOSIS — E2839 Other primary ovarian failure: Secondary | ICD-10-CM | POA: Insufficient documentation

## 2024-01-01 DIAGNOSIS — Z780 Asymptomatic menopausal state: Secondary | ICD-10-CM | POA: Diagnosis not present

## 2024-01-02 ENCOUNTER — Encounter: Payer: Self-pay | Admitting: Family Medicine

## 2024-01-05 DIAGNOSIS — H2513 Age-related nuclear cataract, bilateral: Secondary | ICD-10-CM | POA: Diagnosis not present

## 2024-01-05 DIAGNOSIS — H524 Presbyopia: Secondary | ICD-10-CM | POA: Diagnosis not present

## 2024-01-05 DIAGNOSIS — Z135 Encounter for screening for eye and ear disorders: Secondary | ICD-10-CM | POA: Diagnosis not present

## 2024-01-27 DIAGNOSIS — H903 Sensorineural hearing loss, bilateral: Secondary | ICD-10-CM | POA: Diagnosis not present

## 2024-04-14 ENCOUNTER — Other Ambulatory Visit: Payer: Self-pay | Admitting: Family Medicine

## 2024-04-14 ENCOUNTER — Telehealth: Payer: Self-pay

## 2024-04-14 DIAGNOSIS — I4891 Unspecified atrial fibrillation: Secondary | ICD-10-CM

## 2024-04-14 NOTE — Telephone Encounter (Signed)
 Pt on anticoagulant, depression meds, etc. Per [name] refill policy, overdue for 6 month f/u

## 2024-04-14 NOTE — Telephone Encounter (Signed)
 Copied from CRM #[id number]. Topic: Clinical - Medical Advice >> Apr 14, 2024 10:35 AM Nessti S wrote: Reason for CRM: pt called because she wants to speak with nurse on why she has to schedule appt when she went to her yearly physical and pcp didn't mention another appt. She would like a call back

## 2024-04-20 NOTE — Telephone Encounter (Signed)
 Mychart message sent to pt.

## 2024-04-26 ENCOUNTER — Ambulatory Visit: Admitting: Family Medicine

## 2024-04-30 NOTE — Progress Notes (Deleted)
 [facility] [address] [place], [place] [address] [phone]  Date:  05/03/2024   Name:  Beth Arroyo   DOB:  19-Feb-1956   MRN:  [id number]  PCP:  Watt Harlene BROCKS, MD    Chief Complaint: No chief complaint on file.   History of Present Illness:  Beth Arroyo is a 69 y.o. very pleasant female patient who presents with the following:  Patient seen today for periodic follow-up.  I saw her most recently in May History of paroxysmal atrial fib, depression/grief, hypertension, osteopenia, hyperlipidemia.  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  Importance of compliance with diet medication stressed and patient verbalized standing. Paroxysmal atrial fibrillation:I discussed with the patient atrial fibrillation, disease process. Management and therapy including rate and rhythm control, anticoagulation benefits and potential risks were discussed extensively with the patient. Patient had multiple questions which were answered to patient's satisfaction. Mixed dyslipidemia: On lipid-lowering medications followed by primary care. Obesity: Weight reduction stressed diet emphasized.  She promises to do better.  Risks of obesity explained. Patient will be seen in follow-up appointment in 9 months or earlier if the patient has any concerns  Colon cancer screening Shingrix Most recent COVID booster? Flu shot Mammogram is up-to-date I did blood work for her in May; D deficiency, A1c 5.8  Eliquis  Wellbutrin  Metoprolol  Sertraline  Simvastatin   Discussed the use of AI scribe software for clinical note transcription with the patient, who gave verbal consent to proceed.  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   Tenosynovitis of right hand 09/21/2020   Osteopenia 06/30/2020   Arrhythmia 05/24/2020   Depression    Essential hypertension 08/30/2019   Overweight 08/30/2019   Paroxysmal atrial fibrillation (HCC) 07/22/2019   Hyperlipidemia 07/22/2019   Obesity (BMI 35.0-39.9 without comorbidity) 07/22/2019   Grief at loss of child 07/22/2019   GERD (gastroesophageal reflux disease) 10/13/2015   Recurrent major depressive disorder, in full remission 10/13/2015   Annual physical exam 10/13/2015   History of adenomatous polyp of colon 10/13/2015    Past Medical History:  Diagnosis Date   Annual physical exam 10/13/2015   Arrhythmia 05/24/2020   Depression    Essential hypertension    GERD (gastroesophageal reflux disease) 10/13/2015   Grief at loss of child 07/22/2019   History of adenomatous polyp of colon 10/13/2015   Hyperlipidemia    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  Medications Ordered Prior to Encounter[3]  Review of Systems:  As per HPI- otherwise negative.   Physical Examination: There  were no vitals filed for this visit. There were no vitals filed for this visit. There is no height or weight on file to calculate BMI. Ideal Body Weight:    GEN: no acute distress. HEENT: Atraumatic, Normocephalic.  Ears  and Nose: No external deformity. CV: RRR, No M/G/R. No JVD. No thrill. No extra heart sounds. PULM: CTA B, no wheezes, crackles, rhonchi. No retractions. No resp. distress. No accessory muscle use. ABD: S, NT, ND, +BS. No rebound. No HSM. EXTR: No c/c/e PSYCH: Normally interactive. Conversant.    Assessment and Plan: No diagnosis found.  Assessment & Plan   Signed Harlene Schroeder, MD    [1]  Social History Tobacco Use   Smoking status: Former    Current packs/day: 0.00    Average packs/day: 0.3 packs/day for 2.0 years (0.5 ttl pk-yrs)    Types: Cigarettes    Start date: 08/30/2006    Quit date: 08/29/2008    Years since quitting: 15.6   Smokeless tobacco: Never  Substance Use Topics   Alcohol use: Yes    Comment: rare   Drug use: Never  [2] No Known Allergies [3]  Current Outpatient Medications on File Prior to Visit  Medication Sig Dispense Refill   apixaban  (ELIQUIS ) 5 MG TABS tablet Take 1 tablet (5 mg total) by mouth 2 (two) times daily. 180 tablet 0   buPROPion  (WELLBUTRIN ) 75 MG tablet Take 1 tablet (75 mg total) by mouth daily. Appt for refills 90 tablet 3   Cholecalciferol (D3 2000) 50 MCG (2000 UT) CAPS Take 2,000 Units by mouth daily.     metoprolol  succinate (TOPROL -XL) 50 MG 24 hr tablet TAKE 1 TABLET BY MOUTH EVERY DAY 90 tablet 3   omeprazole (PRILOSEC) 20 MG capsule Take 20 mg by mouth daily.     sertraline  (ZOLOFT ) 50 MG tablet TAKE 1 TABLET BY MOUTH EVERY DAY 90 tablet 3   simvastatin  (ZOCOR ) 10 MG tablet TAKE 1 TABLET (10 MG TOTAL) BY MOUTH DAILY AT 6 PM. 90 tablet 3   No current facility-administered medications on file prior to visit.   "

## 2024-05-03 ENCOUNTER — Ambulatory Visit: Admitting: Family Medicine

## 2024-05-08 NOTE — Progress Notes (Unsigned)
 [facility] [address], [address] [place], [place] [address] [phone]  Date:  05/10/2024   Name:  Beth Arroyo   DOB:  July 27, 1955   MRN:  [id number]  PCP:  Watt Harlene BROCKS, MD    Chief Complaint: No chief complaint on file.   History of Present Illness:  Beth Arroyo is a 69 y.o. very pleasant female patient who presents with the following:  Patient seen today for periodic follow-up.  I saw her most recently in May History of paroxysmal atrial fib, depression/grief, hypertension, osteopenia, hyperlipidemia.  Mild prediabetes   She lost her son Beth Arroyo in 37 at age 72 She is a retired [employer] information [employer] manager She saw cardiology for follow-up in June: Primary prevention stressed with the patient.  Importance of compliance with diet medication stressed and patient verbalized standing. Paroxysmal atrial fibrillation:I discussed with the patient atrial fibrillation, disease process. Management and therapy including rate and rhythm control, anticoagulation benefits and potential risks were discussed extensively with the patient. Patient had multiple questions which were answered to patient's satisfaction. Mixed dyslipidemia: On lipid-lowering medications followed by primary care. Obesity: Weight reduction stressed diet emphasized.  She promises to do better.  Risks of obesity explained. Patient will be seen in follow-up appointment in 9 months or earlier if the patient has any concerns  Colon cancer screening Shingrix Most recent COVID booster? Flu shot Mammogram is up-to-date I did blood work for her in May; D deficiency, A1c 5.8  Eliquis  Wellbutrin  Metoprolol  Sertraline  Simvastatin   Discussed the use of AI scribe software for clinical note transcription with the patient, who gave verbal consent to proceed.  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   Tenosynovitis of right hand 09/21/2020   Osteopenia 06/30/2020   Arrhythmia 05/24/2020   Depression    Essential hypertension 08/30/2019   Overweight 08/30/2019   Paroxysmal atrial fibrillation (HCC) 07/22/2019   Hyperlipidemia 07/22/2019   Obesity (BMI 35.0-39.9 without comorbidity) 07/22/2019   Grief at loss of child 07/22/2019   GERD (gastroesophageal reflux disease) 10/13/2015   Recurrent major depressive disorder, in full remission 10/13/2015   Annual physical exam 10/13/2015   History of adenomatous polyp of colon 10/13/2015    Past Medical History:  Diagnosis Date   Annual physical exam 10/13/2015   Arrhythmia 05/24/2020   Depression    Essential hypertension    GERD (gastroesophageal reflux disease) 10/13/2015   Grief at loss of child 07/22/2019   History of adenomatous polyp of colon 10/13/2015   Hyperlipidemia    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  Medications Ordered Prior to Encounter[3]  Review of Systems:  As per HPI- otherwise negative.   Physical Examination: There  were no vitals filed for this visit. There were no vitals filed for this visit. There is no height or weight on file to calculate BMI. Ideal Body Weight:    GEN: no acute distress. HEENT: Atraumatic,  Normocephalic.  Ears and Nose: No external deformity. CV: RRR, No M/G/R. No JVD. No thrill. No extra heart sounds. PULM: CTA B, no wheezes, crackles, rhonchi. No retractions. No resp. distress. No accessory muscle use. ABD: S, NT, ND, +BS. No rebound. No HSM. EXTR: No c/c/e PSYCH: Normally interactive. Conversant.    Assessment and Plan: No diagnosis found.  Assessment & Plan   Signed Harlene Schroeder, MD     [1]  Social History Tobacco Use   Smoking status: Former    Current packs/day: 0.00    Average packs/day: 0.3 packs/day for 2.0 years (0.5 ttl pk-yrs)    Types: Cigarettes    Start date: 08/30/2006    Quit date: 08/29/2008    Years since quitting: 15.7   Smokeless tobacco: Never  Substance Use Topics   Alcohol use: Yes    Comment: rare   Drug use: Never  [2] No Known Allergies [3]  Current Outpatient Medications on File Prior to Visit  Medication Sig Dispense Refill   apixaban  (ELIQUIS ) 5 MG TABS tablet Take 1 tablet (5 mg total) by mouth 2 (two) times daily. 180 tablet 0   buPROPion  (WELLBUTRIN ) 75 MG tablet Take 1 tablet (75 mg total) by mouth daily. Appt for refills 90 tablet 3   Cholecalciferol (D3 2000) 50 MCG (2000 UT) CAPS Take 2,000 Units by mouth daily.     metoprolol  succinate (TOPROL -XL) 50 MG 24 hr tablet TAKE 1 TABLET BY MOUTH EVERY DAY 90 tablet 3   omeprazole (PRILOSEC) 20 MG capsule Take 20 mg by mouth daily.     sertraline  (ZOLOFT ) 50 MG tablet TAKE 1 TABLET BY MOUTH EVERY DAY 90 tablet 3   simvastatin  (ZOCOR ) 10 MG tablet TAKE 1 TABLET (10 MG TOTAL) BY MOUTH DAILY AT 6 PM. 90 tablet 3   No current facility-administered medications on file prior to visit.   "

## 2024-05-10 ENCOUNTER — Encounter: Payer: Self-pay | Admitting: Family Medicine

## 2024-05-10 ENCOUNTER — Ambulatory Visit: Admitting: Family Medicine

## 2024-05-10 VITALS — BP 120/80 | HR 95 | Temp 98.2°F | Ht 61.0 in | Wt 196.2 lb

## 2024-05-10 DIAGNOSIS — E559 Vitamin D deficiency, unspecified: Secondary | ICD-10-CM | POA: Diagnosis not present

## 2024-05-10 DIAGNOSIS — Z23 Encounter for immunization: Secondary | ICD-10-CM

## 2024-05-10 DIAGNOSIS — R7303 Prediabetes: Secondary | ICD-10-CM | POA: Diagnosis not present

## 2024-05-10 DIAGNOSIS — I4891 Unspecified atrial fibrillation: Secondary | ICD-10-CM

## 2024-05-10 DIAGNOSIS — E782 Mixed hyperlipidemia: Secondary | ICD-10-CM | POA: Diagnosis not present

## 2024-05-10 DIAGNOSIS — F3342 Major depressive disorder, recurrent, in full remission: Secondary | ICD-10-CM

## 2024-05-10 LAB — BASIC METABOLIC PANEL WITH GFR
BUN: 14 mg/dL (ref 6–23)
CO2: 25 meq/L (ref 19–32)
Calcium: 9.1 mg/dL (ref 8.4–10.5)
Chloride: 105 meq/L (ref 96–112)
Creatinine, Ser: 0.63 mg/dL (ref 0.40–1.20)
GFR: 89.79 mL/min
Glucose, Bld: 95 mg/dL (ref 70–99)
Potassium: 4.6 meq/L (ref 3.5–5.1)
Sodium: 139 meq/L (ref 135–145)

## 2024-05-10 LAB — VITAMIN D 25 HYDROXY (VIT D DEFICIENCY, FRACTURES): VITD: 20.86 ng/mL — ABNORMAL LOW (ref 30.00–100.00)

## 2024-05-10 LAB — HEMOGLOBIN A1C: Hgb A1c MFr Bld: 5.9 % (ref 4.6–6.5)

## 2024-05-10 MED ORDER — APIXABAN 5 MG PO TABS: 5.0000 mg | ORAL_TABLET | Freq: Two times a day (BID) | ORAL | 3 refills | Status: AC

## 2024-05-10 MED ORDER — SERTRALINE HCL 50 MG PO TABS: 50.0000 mg | ORAL_TABLET | Freq: Every day | ORAL | 3 refills | Status: AC

## 2024-05-10 MED ORDER — BUPROPION HCL 75 MG PO TABS: 75.0000 mg | ORAL_TABLET | Freq: Every day | ORAL | 3 refills | Status: AC

## 2024-05-10 MED ORDER — SIMVASTATIN 10 MG PO TABS: 10.0000 mg | ORAL_TABLET | Freq: Every day | ORAL | 3 refills | Status: AC

## 2024-05-10 NOTE — Addendum Note (Signed)
 Addended by: ORVIN HARLENE HERO on: 05/10/2024 11:30 AM   Modules accepted: Orders

## 2024-05-10 NOTE — Patient Instructions (Signed)
 Good to see you today!  I will be in touch with your labs Please see me in about 6 months assuming all is well  Flu shot today Recommend a covid booster and shingles series at your convenience

## 2024-06-17 ENCOUNTER — Ambulatory Visit: Payer: Medicare PPO
